# Patient Record
Sex: Female | Born: 1935 | Race: White | Hispanic: No | Marital: Married | Smoking: Former smoker
Health system: Southern US, Community
[De-identification: ages and names within clinical notes are randomized; demographics above are authoritative.]

## PROBLEM LIST (undated history)

## (undated) DIAGNOSIS — F0390 Unspecified dementia without behavioral disturbance: Secondary | ICD-10-CM

## (undated) DIAGNOSIS — F329 Major depressive disorder, single episode, unspecified: Secondary | ICD-10-CM

## (undated) DIAGNOSIS — I10 Essential (primary) hypertension: Secondary | ICD-10-CM

## (undated) DIAGNOSIS — K219 Gastro-esophageal reflux disease without esophagitis: Secondary | ICD-10-CM

## (undated) DIAGNOSIS — F32A Depression, unspecified: Secondary | ICD-10-CM

## (undated) DIAGNOSIS — E079 Disorder of thyroid, unspecified: Secondary | ICD-10-CM

## (undated) DIAGNOSIS — I639 Cerebral infarction, unspecified: Secondary | ICD-10-CM

## (undated) HISTORY — PX: ABDOMINAL HYSTERECTOMY: SHX81

---

## 2004-12-05 ENCOUNTER — Ambulatory Visit: Payer: Self-pay | Admitting: Internal Medicine

## 2006-10-07 ENCOUNTER — Ambulatory Visit: Payer: Self-pay | Admitting: Ophthalmology

## 2006-10-07 ENCOUNTER — Other Ambulatory Visit: Payer: Self-pay

## 2006-10-14 ENCOUNTER — Ambulatory Visit: Payer: Self-pay | Admitting: Ophthalmology

## 2006-11-14 ENCOUNTER — Ambulatory Visit: Payer: Self-pay | Admitting: Ophthalmology

## 2006-11-20 ENCOUNTER — Ambulatory Visit: Payer: Self-pay | Admitting: Ophthalmology

## 2007-04-22 ENCOUNTER — Inpatient Hospital Stay: Payer: Self-pay | Admitting: Specialist

## 2007-04-22 ENCOUNTER — Other Ambulatory Visit: Payer: Self-pay

## 2007-09-14 ENCOUNTER — Inpatient Hospital Stay: Payer: Self-pay | Admitting: Internal Medicine

## 2007-10-11 ENCOUNTER — Other Ambulatory Visit: Payer: Self-pay

## 2007-10-11 ENCOUNTER — Inpatient Hospital Stay: Payer: Self-pay | Admitting: Internal Medicine

## 2007-10-12 ENCOUNTER — Other Ambulatory Visit: Payer: Self-pay

## 2008-02-03 ENCOUNTER — Inpatient Hospital Stay: Payer: Self-pay | Admitting: Internal Medicine

## 2008-05-08 ENCOUNTER — Inpatient Hospital Stay: Payer: Self-pay | Admitting: Internal Medicine

## 2010-05-01 IMAGING — CR DG CHEST 1V PORT
1 series · 1 of 1 positions shown · non-contrast
Comparison: none

REASON FOR EXAM: syncope
COMMENTS:

[view not recorded]
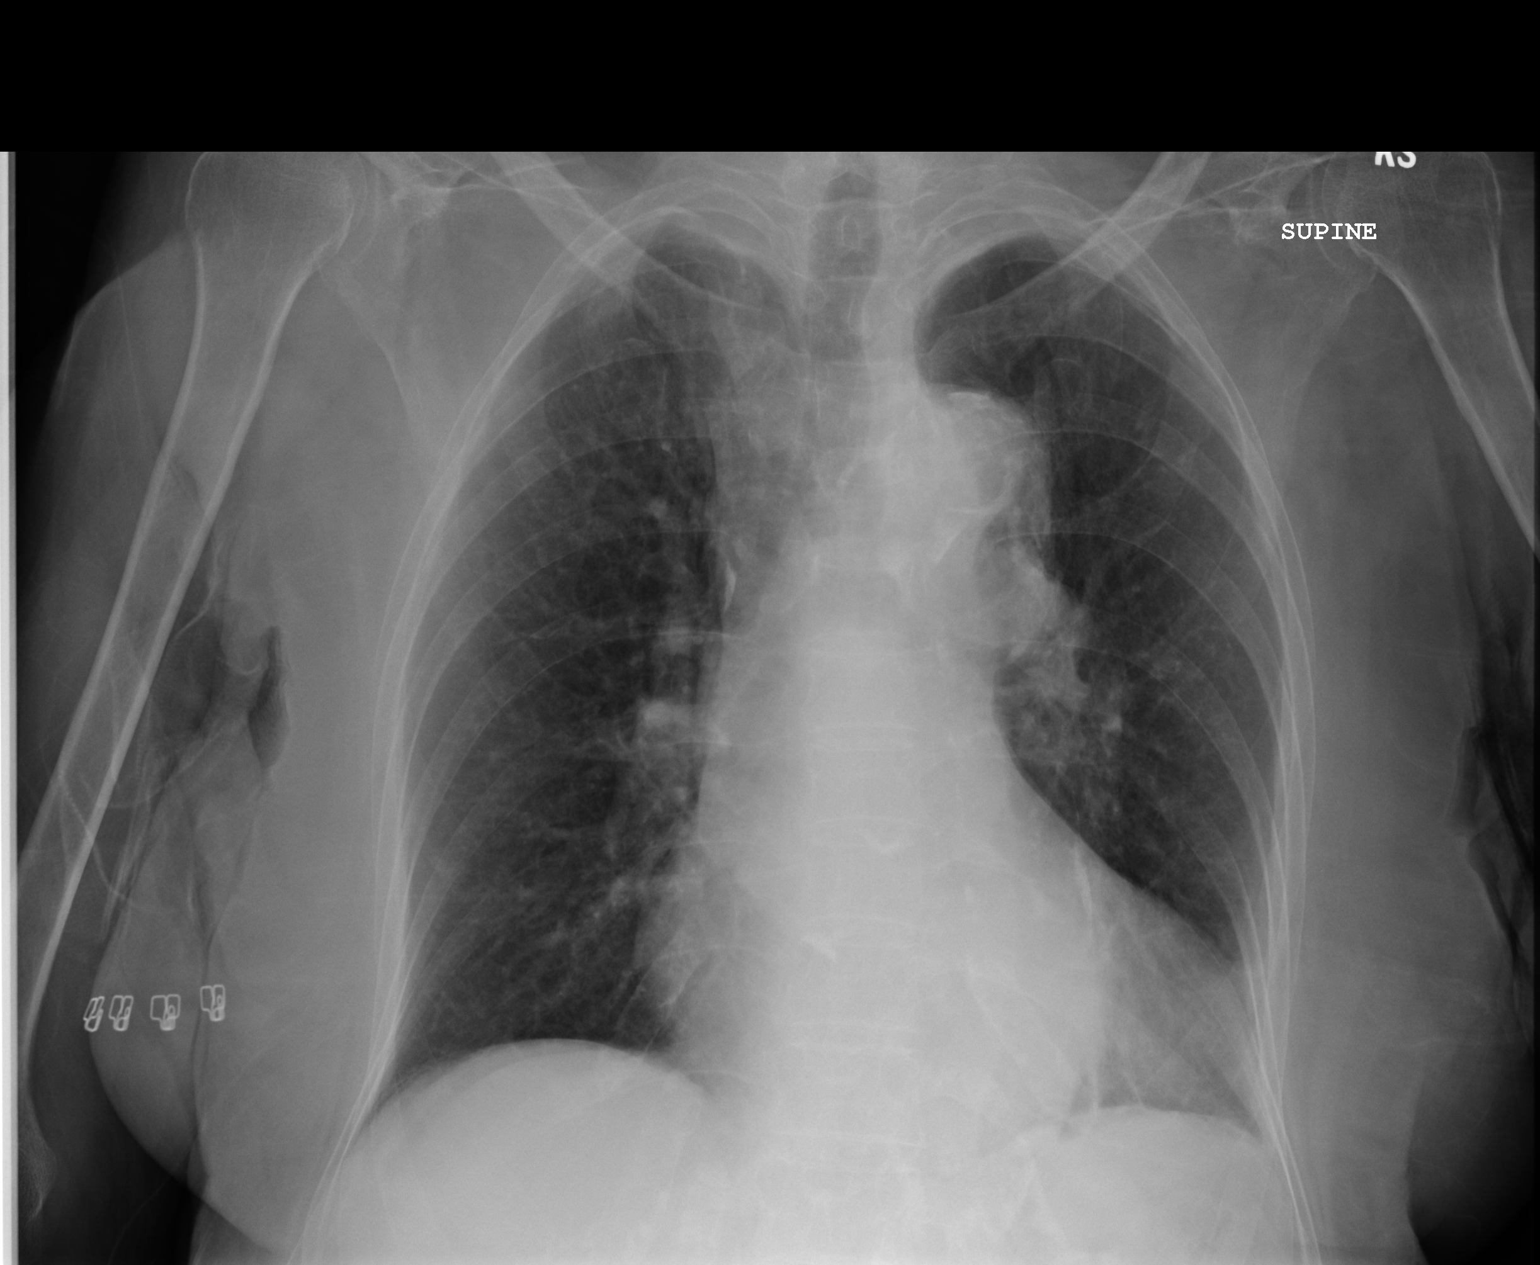

[1 of 1 positions shown; findings below may reference images not displayed]

PROCEDURE:     DXR - DXR PORTABLE CHEST SINGLE VIEW  - May 08, 2008  [DATE]

RESULT:     Comparison is made to prior study dated 02-03-08.

The lungs are hyperinflated. No focal regions of consolidation or focal
infiltrates are appreciated. The cardiac silhouette is within normal limits.
The visualized bony skeleton demonstrates no gross abnormalities.
IMPRESSION: 1. COPD without evidence of acute cardiopulmonary disease.

## 2010-05-01 IMAGING — CT CT CERVICAL SPINE WITHOUT CONTRAST
3 series · 16 of 33 positions shown, 19 images · non-contrast
Comparison: none

REASON FOR EXAM: fall, neck pain
COMMENTS:

[Series 2: axial · axial · 0.30mm/px · z∈[-313,-181]mm · 8 of 78 slices shown, 10 images]
[im 6/78  soft-tissue]
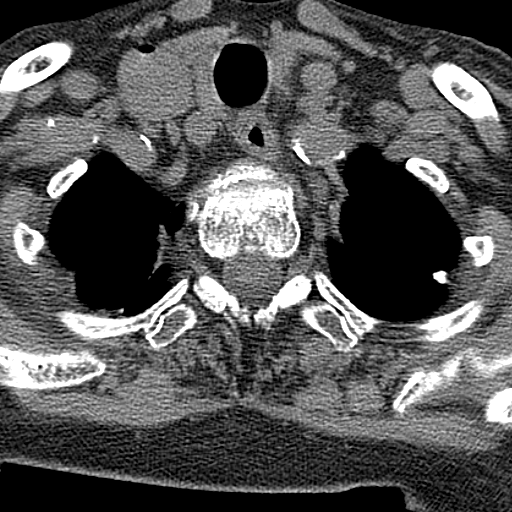
[im 6/78  bone]
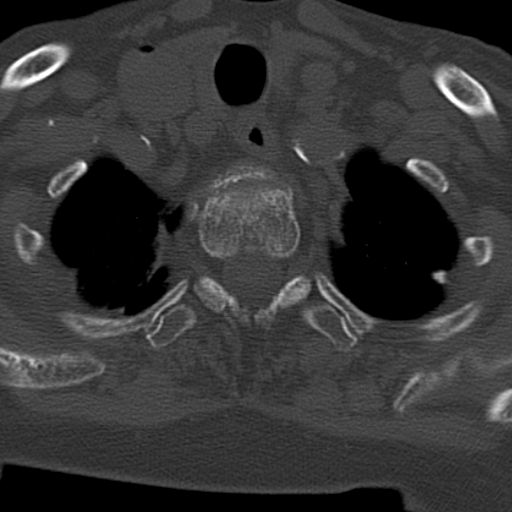
[im 18/78  bone]
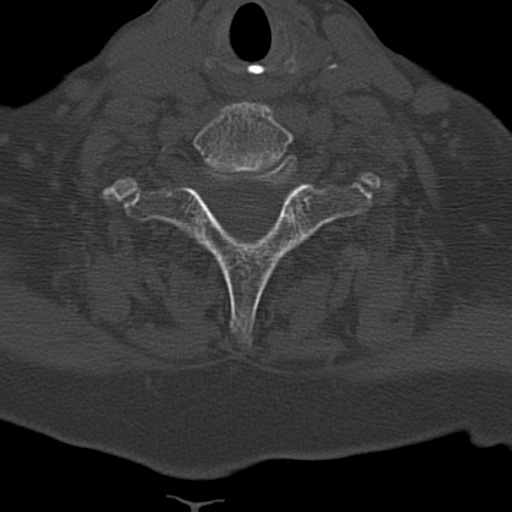
[im 24/78  bone]
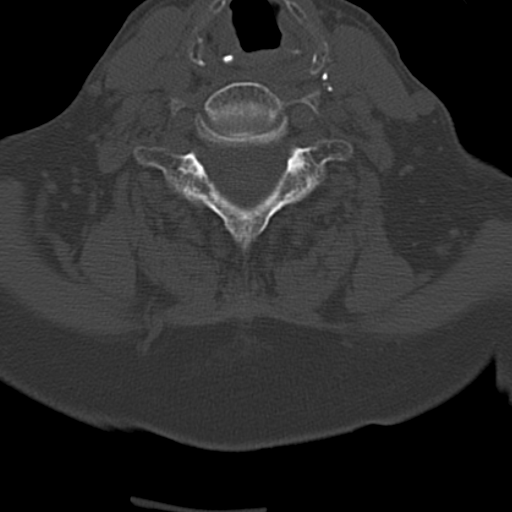
[im 36/78  bone]
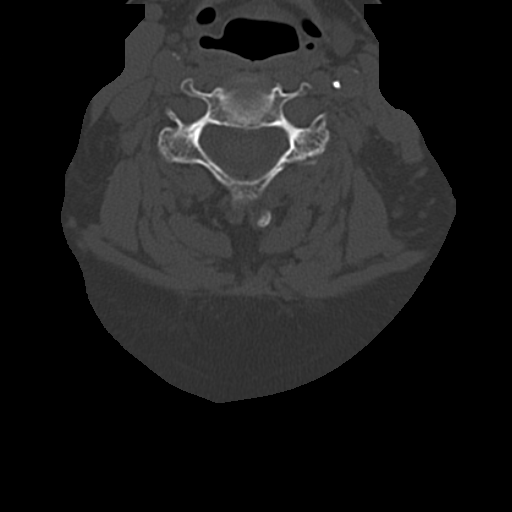
[im 42/78  soft-tissue]
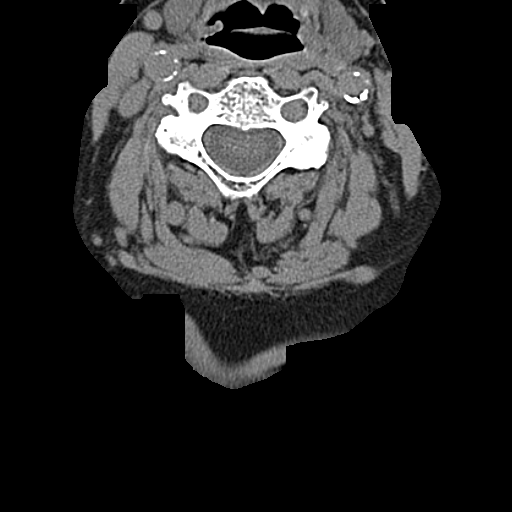
[im 42/78  bone]
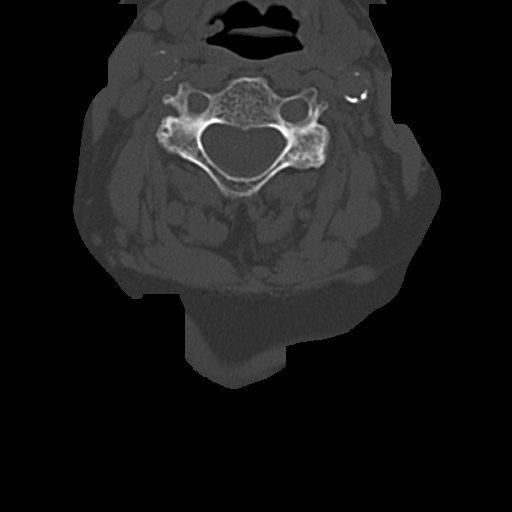
[im 54/78  bone]
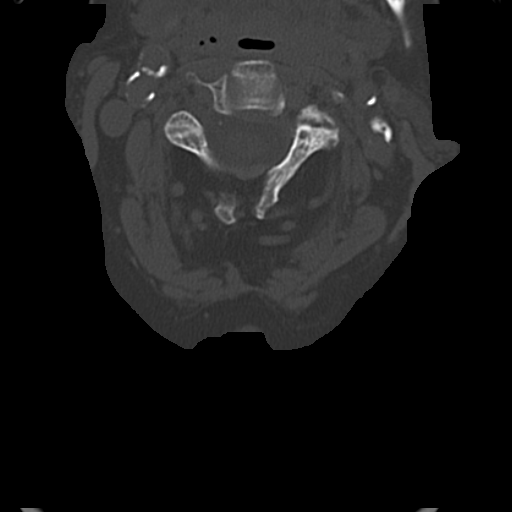
[im 60/78  bone]
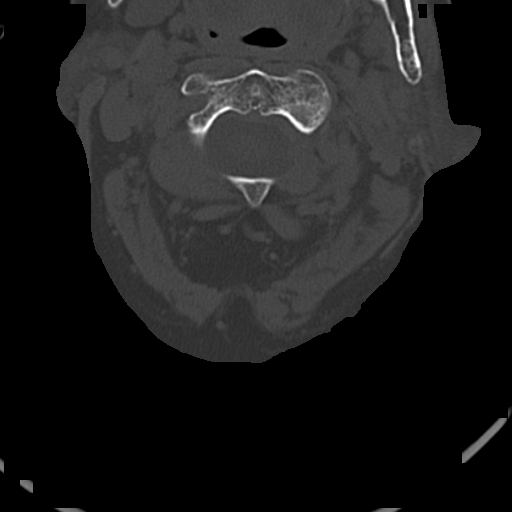
[im 72/78  bone]
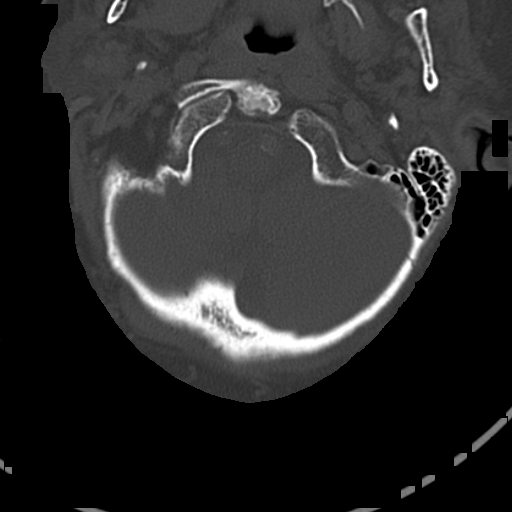

[Series 3: sagittal · sagittal · 0.33mm/px · 5 of 41 slices shown, 6 images]
[im 14/41  bone]
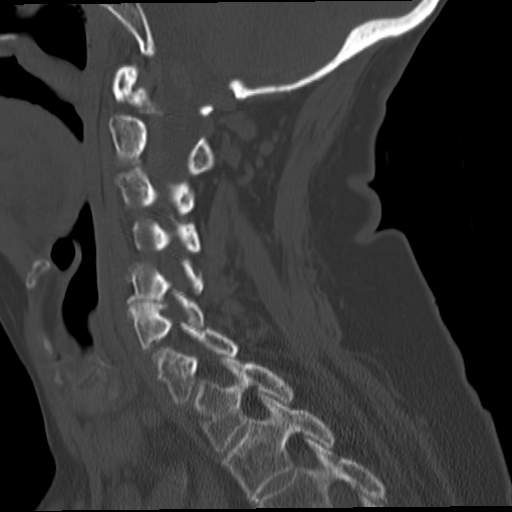
[im 17/41  bone]
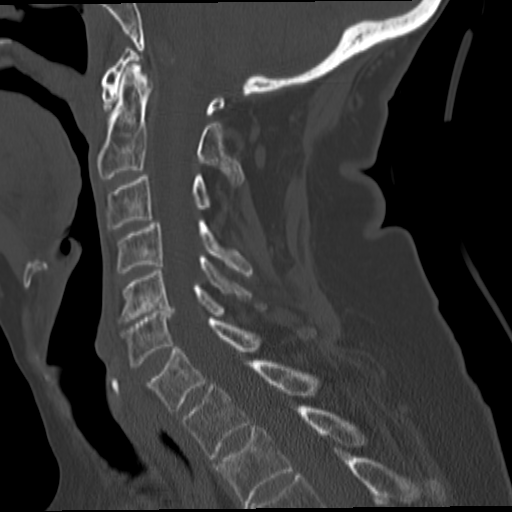
[im 21/41  soft-tissue]
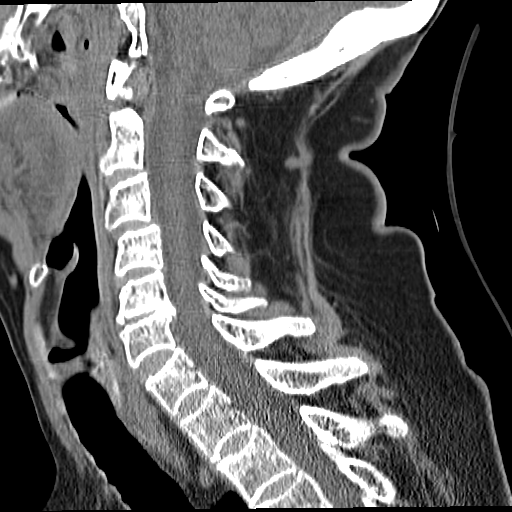
[im 21/41  bone]
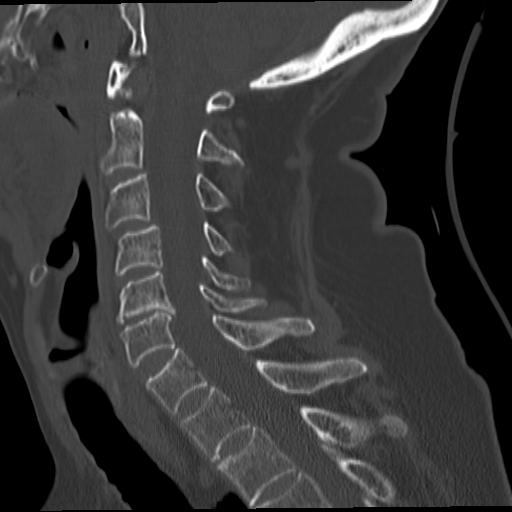
[im 24/41  bone]
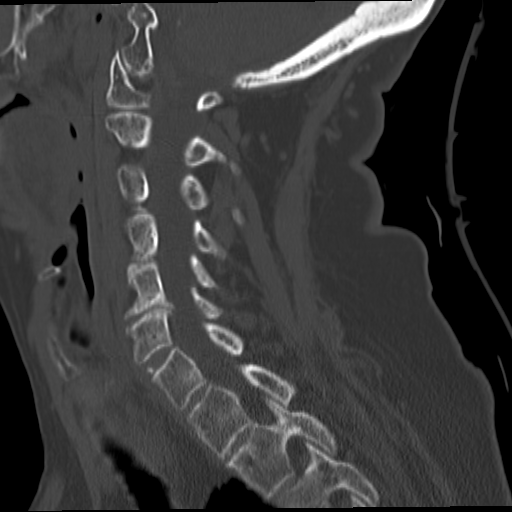
[im 27/41  bone]
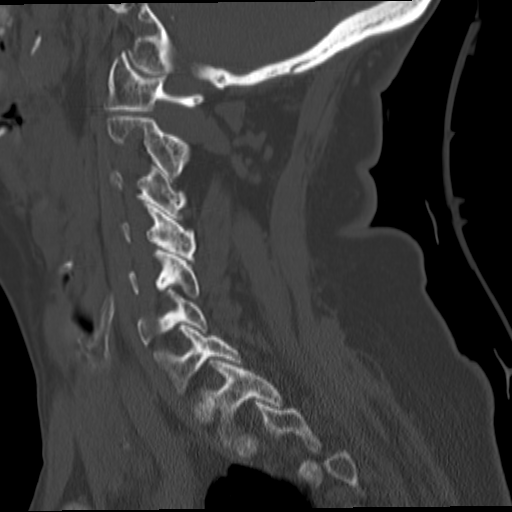

[Series 4: coronal · coronal · 0.33mm/px · 3 of 54 slices shown]
[im 11/54  bone]
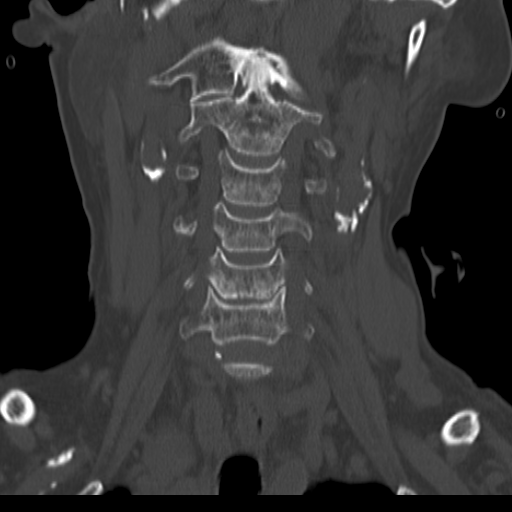
[im 22/54  bone]
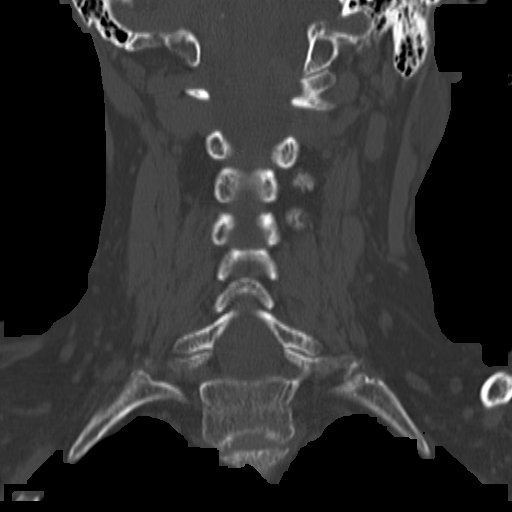
[im 32/54  bone]
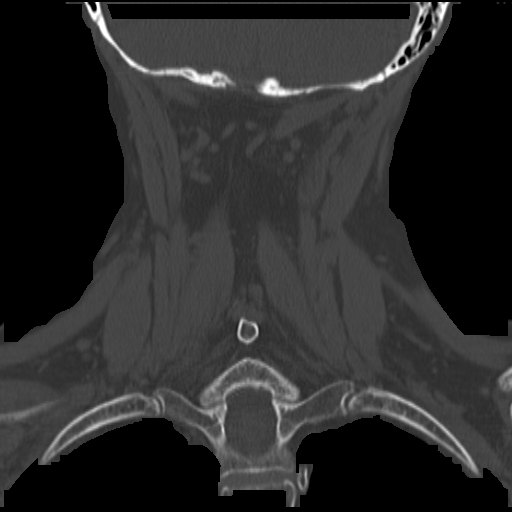

[16 of 33 positions shown; findings below may reference images not displayed]

PROCEDURE:     CT  - CT CERVICAL SPINE WO  - May 08, 2008 [DATE]

RESULT:     Multiplanar imaging of the cervical spine is obtained utilizing
a bone algorithm.

There is no evidence of acute fracture or dislocation or prevertebral soft
tissue swelling. Degenerative change is appreciated within the mid cervical
spine at the C5-6 level evident by disc space narrowing, osteophytosis and
subchondral sclerosis. There is increased lordotic curvature at this level
secondary to degenerative disc disease.  There is otherwise no further
evidence of fracture. Degenerative changes are also appreciated evident by
multilevel facet hypertrophy.
IMPRESSION: 1. Degenerative changes within the cervical spine without evidence of acute
osseous abnormalities.

Dr. Lalit of the Emergency Department was informed of these findings at
the time of the initial interpretation.

## 2011-10-24 ENCOUNTER — Emergency Department: Payer: Self-pay | Admitting: Emergency Medicine

## 2011-10-24 LAB — COMPREHENSIVE METABOLIC PANEL
Alkaline Phosphatase: 100 U/L (ref 50–136)
Anion Gap: 4 — ABNORMAL LOW (ref 7–16)
BUN: 19 mg/dL — ABNORMAL HIGH (ref 7–18)
Calcium, Total: 8.4 mg/dL — ABNORMAL LOW (ref 8.5–10.1)
EGFR (Non-African Amer.): >60
Glucose: 95 mg/dL (ref 65–99)
Osmolality: 270 (ref 275–301)
SGOT(AST): 24 U/L (ref 15–37)
Sodium: 134 mmol/L — ABNORMAL LOW (ref 136–145)

## 2011-10-24 LAB — CK TOTAL AND CKMB (NOT AT ARMC)
CK, Total: 42 U/L (ref 21–215)
CK-MB: 1.2 ng/mL (ref 0.5–3.6)

## 2011-10-24 LAB — CBC
HGB: 14.7 g/dL (ref 12.0–16.0)
MCH: 32.4 pg (ref 26.0–34.0)
MCHC: 33.3 g/dL (ref 32.0–36.0)
Platelet: 180 10*3/uL (ref 150–440)
RDW: 13.3 % (ref 11.5–14.5)
WBC: 8.6 10*3/uL (ref 3.6–11.0)

## 2011-10-24 LAB — TROPONIN I: Troponin-I: <0.02 ng/mL

## 2012-11-27 ENCOUNTER — Emergency Department: Payer: Self-pay | Admitting: Emergency Medicine

## 2012-11-27 LAB — COMPREHENSIVE METABOLIC PANEL
Albumin: 3.7 g/dL (ref 3.4–5.0)
Anion Gap: 6 — ABNORMAL LOW (ref 7–16)
BUN: 13 mg/dL (ref 7–18)
Bilirubin,Total: 0.3 mg/dL (ref 0.2–1.0)
Calcium, Total: 8.4 mg/dL — ABNORMAL LOW (ref 8.5–10.1)
EGFR (African American): >60
EGFR (Non-African Amer.): >60
Glucose: 101 mg/dL — ABNORMAL HIGH (ref 65–99)
Osmolality: 272 (ref 275–301)
Potassium: 3.5 mmol/L (ref 3.5–5.1)
SGOT(AST): 25 U/L (ref 15–37)
SGPT (ALT): 17 U/L (ref 12–78)
Sodium: 136 mmol/L (ref 136–145)
Total Protein: 6.4 g/dL (ref 6.4–8.2)

## 2012-11-27 LAB — CBC
HCT: 39.4 % (ref 35.0–47.0)
HGB: 13.5 g/dL (ref 12.0–16.0)
MCH: 30.6 pg (ref 26.0–34.0)
MCHC: 34.3 g/dL (ref 32.0–36.0)
Platelet: 198 10*3/uL (ref 150–440)
RBC: 4.41 10*6/uL (ref 3.80–5.20)

## 2015-05-10 ENCOUNTER — Emergency Department: Payer: Medicare Other

## 2015-05-10 ENCOUNTER — Inpatient Hospital Stay
Admission: EM | Admit: 2015-05-10 | Discharge: 2015-05-12 | DRG: 373 | Disposition: A | Discharge status: Skilled Nursing Facility | Payer: Medicare Other | Attending: Internal Medicine | Admitting: Internal Medicine

## 2015-05-10 ENCOUNTER — Inpatient Hospital Stay: Payer: Medicare Other

## 2015-05-10 ENCOUNTER — Encounter: Payer: Self-pay | Admitting: Intensive Care

## 2015-05-10 DIAGNOSIS — Z8673 Personal history of transient ischemic attack (TIA), and cerebral infarction without residual deficits: Secondary | ICD-10-CM

## 2015-05-10 DIAGNOSIS — Z9181 History of falling: Secondary | ICD-10-CM

## 2015-05-10 DIAGNOSIS — L8990 Pressure ulcer of unspecified site, unspecified stage: Secondary | ICD-10-CM | POA: Diagnosis present

## 2015-05-10 DIAGNOSIS — R55 Syncope and collapse: Secondary | ICD-10-CM

## 2015-05-10 DIAGNOSIS — Z87891 Personal history of nicotine dependence: Secondary | ICD-10-CM | POA: Diagnosis not present

## 2015-05-10 DIAGNOSIS — E876 Hypokalemia: Secondary | ICD-10-CM | POA: Diagnosis present

## 2015-05-10 DIAGNOSIS — Z90710 Acquired absence of both cervix and uterus: Secondary | ICD-10-CM

## 2015-05-10 DIAGNOSIS — Z23 Encounter for immunization: Secondary | ICD-10-CM | POA: Diagnosis not present

## 2015-05-10 DIAGNOSIS — Z66 Do not resuscitate: Secondary | ICD-10-CM | POA: Diagnosis present

## 2015-05-10 DIAGNOSIS — Z79899 Other long term (current) drug therapy: Secondary | ICD-10-CM

## 2015-05-10 DIAGNOSIS — I10 Essential (primary) hypertension: Secondary | ICD-10-CM | POA: Diagnosis present

## 2015-05-10 DIAGNOSIS — R296 Repeated falls: Secondary | ICD-10-CM | POA: Diagnosis present

## 2015-05-10 DIAGNOSIS — A044 Other intestinal Escherichia coli infections: Principal | ICD-10-CM | POA: Diagnosis present

## 2015-05-10 DIAGNOSIS — K219 Gastro-esophageal reflux disease without esophagitis: Secondary | ICD-10-CM | POA: Diagnosis present

## 2015-05-10 DIAGNOSIS — S139XXA Sprain of joints and ligaments of unspecified parts of neck, initial encounter: Secondary | ICD-10-CM

## 2015-05-10 DIAGNOSIS — E079 Disorder of thyroid, unspecified: Secondary | ICD-10-CM | POA: Diagnosis present

## 2015-05-10 DIAGNOSIS — E039 Hypothyroidism, unspecified: Secondary | ICD-10-CM | POA: Diagnosis present

## 2015-05-10 DIAGNOSIS — S0990XA Unspecified injury of head, initial encounter: Secondary | ICD-10-CM

## 2015-05-10 DIAGNOSIS — Z833 Family history of diabetes mellitus: Secondary | ICD-10-CM

## 2015-05-10 DIAGNOSIS — R0902 Hypoxemia: Secondary | ICD-10-CM

## 2015-05-10 DIAGNOSIS — Z7982 Long term (current) use of aspirin: Secondary | ICD-10-CM | POA: Diagnosis not present

## 2015-05-10 DIAGNOSIS — F329 Major depressive disorder, single episode, unspecified: Secondary | ICD-10-CM | POA: Diagnosis present

## 2015-05-10 DIAGNOSIS — R42 Dizziness and giddiness: Secondary | ICD-10-CM

## 2015-05-10 HISTORY — DX: Depression, unspecified: F32.A

## 2015-05-10 HISTORY — DX: Cerebral infarction, unspecified: I63.9

## 2015-05-10 HISTORY — DX: Gastro-esophageal reflux disease without esophagitis: K21.9

## 2015-05-10 HISTORY — DX: Disorder of thyroid, unspecified: E07.9

## 2015-05-10 HISTORY — DX: Major depressive disorder, single episode, unspecified: F32.9

## 2015-05-10 HISTORY — DX: Essential (primary) hypertension: I10

## 2015-05-10 LAB — CBC
HEMATOCRIT: 34.8 % — AB (ref 35.0–47.0)
HEMOGLOBIN: 11.6 g/dL — AB (ref 12.0–16.0)
MCH: 26.1 pg (ref 26.0–34.0)
MCHC: 33.3 g/dL (ref 32.0–36.0)
MCV: 78.4 fL — ABNORMAL LOW (ref 80.0–100.0)
Platelets: 209 10*3/uL (ref 150–440)
RBC: 4.45 MIL/uL (ref 3.80–5.20)
RDW: 17.8 % — ABNORMAL HIGH (ref 11.5–14.5)
WBC: 7.2 10*3/uL (ref 3.6–11.0)

## 2015-05-10 LAB — COMPREHENSIVE METABOLIC PANEL
ALBUMIN: 2.7 g/dL — AB (ref 3.5–5.0)
ALK PHOS: 117 U/L (ref 38–126)
ALT: 13 U/L — ABNORMAL LOW (ref 14–54)
ANION GAP: 4 — AB (ref 5–15)
AST: 22 U/L (ref 15–41)
BILIRUBIN TOTAL: 1 mg/dL (ref 0.3–1.2)
BUN: 16 mg/dL (ref 6–20)
CALCIUM: 7.8 mg/dL — AB (ref 8.9–10.3)
CO2: 31 mmol/L (ref 22–32)
CREATININE: 0.71 mg/dL (ref 0.44–1.00)
Chloride: 104 mmol/L (ref 101–111)
GFR calc non Af Amer: >60 mL/min (ref >60)
GLUCOSE: 83 mg/dL (ref 65–99)
Potassium: 2.9 mmol/L — CL (ref 3.5–5.1)
Sodium: 139 mmol/L (ref 135–145)
TOTAL PROTEIN: 4.9 g/dL — AB (ref 6.5–8.1)

## 2015-05-10 LAB — URINALYSIS COMPLETE WITH MICROSCOPIC (ARMC ONLY)
BACTERIA UA: NONE SEEN
Bilirubin Urine: NEGATIVE
GLUCOSE, UA: NEGATIVE mg/dL
Hgb urine dipstick: NEGATIVE
Leukocytes, UA: NEGATIVE
NITRITE: NEGATIVE
Protein, ur: NEGATIVE mg/dL
SPECIFIC GRAVITY, URINE: 1.011 (ref 1.005–1.030)
Squamous Epithelial / LPF: NONE SEEN
pH: 7 (ref 5.0–8.0)

## 2015-05-10 LAB — INFLUENZA PANEL BY PCR (TYPE A & B)
H1N1FLUPCR: NOT DETECTED
INFLAPCR: NEGATIVE
INFLBPCR: NEGATIVE

## 2015-05-10 LAB — TROPONIN I: Troponin I: <0.03 ng/mL (ref <0.031)

## 2015-05-10 MED ORDER — VERAPAMIL HCL ER 180 MG PO TBCR
180.0000 mg | EXTENDED_RELEASE_TABLET | Freq: Every day | ORAL | Status: DC
  Administered 2015-05-11 – 2015-05-12 (×2): 180 mg via ORAL
  Filled 2015-05-10 (×4): qty 1

## 2015-05-10 MED ORDER — POTASSIUM CHLORIDE CRYS ER 20 MEQ PO TBCR
40.0000 meq | EXTENDED_RELEASE_TABLET | Freq: Two times a day (BID) | ORAL | Status: AC
  Administered 2015-05-10 – 2015-05-11 (×3): 40 meq via ORAL
  Filled 2015-05-10 (×4): qty 2

## 2015-05-10 MED ORDER — SODIUM CHLORIDE 0.9 % IV SOLN
INTRAVENOUS | Status: DC
  Administered 2015-05-10 – 2015-05-12 (×4): via INTRAVENOUS

## 2015-05-10 MED ORDER — VITAMIN E 180 MG (400 UNIT) PO CAPS
400.0000 [IU] | ORAL_CAPSULE | Freq: Every day | ORAL | Status: DC
  Administered 2015-05-10 – 2015-05-12 (×3): 400 [IU] via ORAL
  Filled 2015-05-10 (×3): qty 1

## 2015-05-10 MED ORDER — DONEPEZIL HCL 5 MG PO TABS
10.0000 mg | ORAL_TABLET | Freq: Every day | ORAL | Status: DC
  Administered 2015-05-10 – 2015-05-12 (×3): 10 mg via ORAL
  Filled 2015-05-10 (×3): qty 2

## 2015-05-10 MED ORDER — PANTOPRAZOLE SODIUM 40 MG PO TBEC
40.0000 mg | DELAYED_RELEASE_TABLET | Freq: Every day | ORAL | Status: DC
  Administered 2015-05-10 – 2015-05-12 (×3): 40 mg via ORAL
  Filled 2015-05-10 (×3): qty 1

## 2015-05-10 MED ORDER — COQ-10 200 MG PO CAPS: 1.0000 | ORAL_CAPSULE | Freq: Every day | ORAL | Status: DC

## 2015-05-10 MED ORDER — FERROUS SULFATE 325 (65 FE) MG PO TABS
325.0000 mg | ORAL_TABLET | Freq: Two times a day (BID) | ORAL | Status: DC
  Administered 2015-05-10 – 2015-05-12 (×4): 325 mg via ORAL
  Filled 2015-05-10 (×4): qty 1

## 2015-05-10 MED ORDER — ACETAMINOPHEN 325 MG PO TABS
650.0000 mg | ORAL_TABLET | Freq: Four times a day (QID) | ORAL | Status: DC | PRN
  Administered 2015-05-10 – 2015-05-12 (×2): 650 mg via ORAL
  Filled 2015-05-10 (×2): qty 2

## 2015-05-10 MED ORDER — LEVOTHYROXINE SODIUM 125 MCG PO TABS
125.0000 ug | ORAL_TABLET | Freq: Every day | ORAL | Status: DC
  Administered 2015-05-11: 125 ug via ORAL
  Filled 2015-05-10 (×2): qty 1

## 2015-05-10 MED ORDER — HEPARIN SODIUM (PORCINE) 5000 UNIT/ML IJ SOLN
5000.0000 [IU] | Freq: Three times a day (TID) | INTRAMUSCULAR | Status: DC
  Administered 2015-05-10 – 2015-05-12 (×6): 5000 [IU] via SUBCUTANEOUS
  Filled 2015-05-10 (×6): qty 1

## 2015-05-10 MED ORDER — SODIUM CHLORIDE 0.9% FLUSH
3.0000 mL | Freq: Two times a day (BID) | INTRAVENOUS | Status: DC
  Administered 2015-05-12: 3 mL via INTRAVENOUS

## 2015-05-10 MED ORDER — SODIUM CHLORIDE 0.9 % IV SOLN
1000.0000 mL | Freq: Once | INTRAVENOUS | Status: AC
  Administered 2015-05-10: 1000 mL via INTRAVENOUS

## 2015-05-10 MED ORDER — HYDRALAZINE HCL 20 MG/ML IJ SOLN
10.0000 mg | Freq: Four times a day (QID) | INTRAMUSCULAR | Status: DC | PRN
  Administered 2015-05-10: 10 mg via INTRAVENOUS
  Filled 2015-05-10: qty 1

## 2015-05-10 MED ORDER — ASPIRIN EC 81 MG PO TBEC
81.0000 mg | DELAYED_RELEASE_TABLET | Freq: Every day | ORAL | Status: DC
  Administered 2015-05-10 – 2015-05-12 (×3): 81 mg via ORAL
  Filled 2015-05-10 (×3): qty 1

## 2015-05-10 MED ORDER — LORAZEPAM 0.5 MG PO TABS: 0.5000 mg | ORAL_TABLET | Freq: Two times a day (BID) | ORAL | Status: DC | PRN

## 2015-05-10 MED ORDER — CYANOCOBALAMIN 1000 MCG/ML IJ SOLN
1000.0000 ug | INTRAMUSCULAR | Status: DC
  Filled 2015-05-10: qty 1

## 2015-05-10 NOTE — H&P (Signed)
[facility] - [place] at [facility]   PATIENT NAME: Shelley Newton    MR#:  [id number]  DATE OF BIRTH:  [birth year]  DATE OF ADMISSION:  05/10/2015  PRIMARY CARE PHYSICIAN: No primary care provider on file.   REQUESTING/REFERRING PHYSICIAN: Kinner  CHIEF COMPLAINT:   Chief Complaint  Patient presents with  . Fall    HISTORY OF PRESENT ILLNESS: Shelley Newton  is a 82 y.o. female with a known history of hypertension, stroke, thyroid disease, depression- lives with son and able to walk with a walker. For last few days mainly 3-4 days she is generalized weak and has been falling down. She fell down 3 days ago and could not get up herself so she was on the floor for 2-3 hours. Finally son had to help her to get up. Also when she went to the bathroom she could not clean herself properly and she messed up all over herself with stool. She had a fall down a few times in last few days and injured herself with minor injuries all over. So son brought her to the emergency room today. Patient has slight confusion so history obtained from her son who lives with her. As per him he denies any fever but says that patient always feels cold and that is nothing new for her to ask for multiple blankets and heating blankets. He denies any cough or urinary symptoms or shortness of breath on her. She has been falling down each time she tried to get up today.  PAST MEDICAL HISTORY:   Past Medical History  Diagnosis Date  . Hypertension   . Stroke (HCC)   . Thyroid disease   . GERD (gastroesophageal reflux disease)   . Depression     PAST SURGICAL HISTORY: Past Surgical History  Procedure Laterality Date  . Abdominal hysterectomy      SOCIAL HISTORY:  Social History  Substance Use Topics  . Smoking status: Former Games developer  . Smokeless tobacco: Never Used  . Alcohol Use: No    FAMILY HISTORY:  Family History  Problem Relation Age of Onset  . Diabetes Brother     DRUG  ALLERGIES: No Known Allergies  REVIEW OF SYSTEMS:   CONSTITUTIONAL: No fever, positive for fatigue or weakness.  EYES: No blurred or double vision.  EARS, NOSE, AND THROAT: No tinnitus or ear pain.  RESPIRATORY: No cough, shortness of breath, wheezing or hemoptysis.  CARDIOVASCULAR: No chest pain, orthopnea, edema.  GASTROINTESTINAL: No nausea, vomiting, diarrhea or abdominal pain.  GENITOURINARY: No dysuria, hematuria.  ENDOCRINE: No polyuria, nocturia,  HEMATOLOGY: No anemia, easy bruising or bleeding SKIN: No rash or lesion. MUSCULOSKELETAL: No joint pain or arthritis.   NEUROLOGIC: No tingling, numbness, have generalized weakness.  PSYCHIATRY: No anxiety or depression.   MEDICATIONS AT HOME:  Prior to Admission medications   Medication Sig Start Date End Date Taking? Authorizing Provider  aspirin EC 81 MG tablet Take 1 tablet by mouth daily.   Yes Historical Provider, MD  Coenzyme Q10 (COQ-10) 200 MG CAPS Take 1 tablet by mouth daily.   Yes Historical Provider, MD  cyanocobalamin (,VITAMIN B-12,) 1000 MCG/ML injection Inject 1 mL into the muscle every 30 (thirty) days. 02/03/15  Yes Historical Provider, MD  donepezil (ARICEPT) 10 MG tablet Take 1 tablet by mouth daily. 04/24/15  Yes Historical Provider, MD  ferrous sulfate 325 (65 FE) MG tablet Take 1 tablet by mouth 2 (two) times daily. 04/24/15  Yes Historical Provider, MD  furosemide (LASIX) 20 MG tablet Take 1 tablet by mouth daily. 04/21/15  Yes Historical Provider, MD  KLOR-CON 10 10 MEQ tablet Take 1 tablet by mouth daily. 02/28/15  Yes Historical Provider, MD  levothyroxine (SYNTHROID, LEVOTHROID) 125 MCG tablet Take 125 mcg by mouth daily before breakfast.   Yes Historical Provider, MD  LORazepam (ATIVAN) 0.5 MG tablet Take 1 tablet by mouth 2 (two) times daily as needed. 04/28/15  Yes Historical Provider, MD  omeprazole (PRILOSEC) 20 MG capsule Take 1 capsule by mouth daily. 07/09/14  Yes Historical Provider, MD  verapamil  (CALAN-SR) 180 MG CR tablet Take 1 tablet by mouth daily. 04/21/15  Yes Historical Provider, MD  vitamin E 400 UNIT capsule Take 400 Units by mouth daily.   Yes Historical Provider, MD      PHYSICAL EXAMINATION:   VITAL SIGNS: Blood pressure 167/88, pulse 69, temperature 98.2 F (36.8 C), temperature source Oral, resp. rate 21, height  (1.702 m), weight 68.04 kg (150 lb), SpO2 94 %.  GENERAL:  82 y.o.-year-old patient lying in the bed with no acute distress.  EYES: Pupils equal, round, reactive to light and accommodation. No scleral icterus. Extraocular muscles intact.  HEENT: Head atraumatic, normocephalic. Oropharynx and nasopharynx clear.  NECK:  Supple, no jugular venous distention. No thyroid enlargement, no tenderness.  LUNGS: Normal breath sounds bilaterally, no wheezing, rales,rhonchi or crepitation. No use of accessory muscles of respiration.  CARDIOVASCULAR: S1, S2 normal. Systolic murmurs,   ABDOMEN: Soft, nontender, nondistended. Bowel sounds present. No organomegaly or mass.  EXTREMITIES: No pedal edema, cyanosis, or clubbing.  NEUROLOGIC: Cranial nerves II through XII are intact. Muscle strength 4/5 in all extremities. Sensation intact. Gait not checked.  PSYCHIATRIC: The patient is alert and oriented x 3.  SKIN: No obvious rash, lesion, or ulcer.   LABORATORY PANEL:   CBC  Recent Labs Lab 05/10/15 1026  WBC 7.2  HGB 11.6*  HCT 34.8*  PLT 209  MCV 78.4*  MCH 26.1  MCHC 33.3  RDW 17.8*   ------------------------------------------------------------------------------------------------------------------  Chemistries   Recent Labs Lab 05/10/15 1026  NA 139  K 2.9*  CL 104  CO2 31  GLUCOSE 83  BUN 16  CREATININE 0.71  CALCIUM 7.8*  AST 22  ALT 13*  ALKPHOS 117  BILITOT 1.0   ------------------------------------------------------------------------------------------------------------------ estimated creatinine clearance is 45.5 mL/min (by C-G  formula based on Cr of 0.71). ------------------------------------------------------------------------------------------------------------------ No results for input(s): TSH, T4TOTAL, T3FREE, THYROIDAB in the last 72 hours.  Invalid input(s): FREET3   Coagulation profile No results for input(s): INR, PROTIME in the last 168 hours. ------------------------------------------------------------------------------------------------------------------- No results for input(s): DDIMER in the last 72 hours. -------------------------------------------------------------------------------------------------------------------  Cardiac Enzymes  Recent Labs Lab 05/10/15 1026  TROPONINI <0.03   ------------------------------------------------------------------------------------------------------------------ Invalid input(s): POCBNP  ---------------------------------------------------------------------------------------------------------------  Urinalysis No results found for: COLORURINE, APPEARANCEUR, LABSPEC, PHURINE, GLUCOSEU, HGBUR, BILIRUBINUR, KETONESUR, PROTEINUR, UROBILINOGEN, NITRITE, LEUKOCYTESUR   RADIOLOGY: Dg Shoulder Right  05/10/2015  CLINICAL DATA:  Recent fall with right shoulder pain, initial encounter EXAM: RIGHT SHOULDER - 2+ VIEW COMPARISON:  None. FINDINGS: Degenerative changes of the acromioclavicular joint are noted. No acute fracture or dislocation is seen. No gross soft tissue abnormality is noted. IMPRESSION: Degenerative change without acute abnormality. Electronically Signed   By: Alcide Clever M.D.   On: 05/10/2015 11:56   Ct Head Wo Contrast  05/10/2015  CLINICAL DATA:  Fall.  Hematoma to left posterior head. EXAM: CT HEAD WITHOUT CONTRAST CT CERVICAL SPINE WITHOUT CONTRAST TECHNIQUE: Multidetector  CT imaging of the head and cervical spine was performed following the standard protocol without intravenous contrast. Multiplanar CT image reconstructions of the cervical spine  were also generated. COMPARISON:  11/27/2012 FINDINGS: CT HEAD FINDINGS Sinuses/Soft tissues: Surgical changes about both globes. Left occipital scalp soft tissue swelling is moderate. Osteopenia. No skull fracture. Clear paranasal sinuses and mastoid air cells. Intracranial: Moderate to marked low density in the periventricular white matter likely related to small vessel disease. No mass lesion, hemorrhage, hydrocephalus, acute infarct, intra-axial, or extra-axial fluid collection. CT CERVICAL SPINE FINDINGS Spinal visualization through the bottom of T1. Prevertebral soft tissues are within normal limits. Bilateral carotid atherosclerosis. Biapical pleural-parenchymal scarring, without pneumothorax. Trace left pleural thickening versus less likely fluid. Skull base intact. Multilevel spondylosis, including loss of intervertebral disc height and subchondral sclerosis involving C4-5 and C5-6. Advanced facet arthropathy, especially at C3-5 on the right. Coronal reformats demonstrate a normal C1-C2 articulation. IMPRESSION: 1. Left occipital scalp soft tissue swelling, without acute intracranial abnormality. 2. Cervical spondylosis, without acute fracture or subluxation. 3. Moderate to marked small vessel ischemic change. Electronically Signed   By: Jeronimo Greaves M.D.   On: 05/10/2015 12:04   Ct Cervical Spine Wo Contrast  05/10/2015  CLINICAL DATA:  Fall.  Hematoma to left posterior head. EXAM: CT HEAD WITHOUT CONTRAST CT CERVICAL SPINE WITHOUT CONTRAST TECHNIQUE: Multidetector CT imaging of the head and cervical spine was performed following the standard protocol without intravenous contrast. Multiplanar CT image reconstructions of the cervical spine were also generated. COMPARISON:  11/27/2012 FINDINGS: CT HEAD FINDINGS Sinuses/Soft tissues: Surgical changes about both globes. Left occipital scalp soft tissue swelling is moderate. Osteopenia. No skull fracture. Clear paranasal sinuses and mastoid air cells.  Intracranial: Moderate to marked low density in the periventricular white matter likely related to small vessel disease. No mass lesion, hemorrhage, hydrocephalus, acute infarct, intra-axial, or extra-axial fluid collection. CT CERVICAL SPINE FINDINGS Spinal visualization through the bottom of T1. Prevertebral soft tissues are within normal limits. Bilateral carotid atherosclerosis. Biapical pleural-parenchymal scarring, without pneumothorax. Trace left pleural thickening versus less likely fluid. Skull base intact. Multilevel spondylosis, including loss of intervertebral disc height and subchondral sclerosis involving C4-5 and C5-6. Advanced facet arthropathy, especially at C3-5 on the right. Coronal reformats demonstrate a normal C1-C2 articulation. IMPRESSION: 1. Left occipital scalp soft tissue swelling, without acute intracranial abnormality. 2. Cervical spondylosis, without acute fracture or subluxation. 3. Moderate to marked small vessel ischemic change. Electronically Signed   By: Jeronimo Greaves M.D.   On: 05/10/2015 12:04    EKG: Orders placed or performed in visit on 11/27/12  . EKG 12-Lead  . EKG 12-Lead    IMPRESSION AND PLAN:  * Syncopal episode with generalized weakness with multiple falls.  Orthostatic vitals were checked, they are not very impressive.  CT scan of the head and cervical spine are done to rule out any serious injuries  Patient does not have urinalysis, chest x-ray, influenza test ordered.  I strongly suspect she might be having some infection.  Currently I'll give her some IV fluid, and I ordered all the workup.  If all of them comes out negative then we may need to consider the possibility of stroke, then may need an MRI.  PT evaluation.  * Uncontrolled hypertension   We'll continue home medications for now.  Given hydralazine injection as needed.  * Hypokalemia  Replace orally and check tomorrow.  * Hypothyroidism  Continue levothyroxine.  All the records  are reviewed and case  discussed with ED provider. Management plans discussed with the patient, family and they are in agreement.  CODE STATUS: DO NOT RESUSCITATE, her son Kathlene NovemberMike is healthcare power of attorney. Code Status History    This patient does not have a recorded code status. Please follow your organizational policy for patients in this situation.    Advance Directive Documentation        Most Recent Value   Type of Advance Directive  Healthcare Power of Attorney   Pre-existing out of facility DNR order (yellow form or pink MOST form)     "MOST" Form in Place?         TOTAL TIME TAKING CARE OF THIS PATIENT: 50 minutes.    Altamese DillingVACHHANI, Sabre Leonetti M.D on 05/10/2015   Between 7am to 6pm - Pager - [phone]  After 6pm go to [email or web address] - password [email or web address] [facility]  [facility]  Office  [phone]  CC: Primary care physician; No primary care provider on file.   Note: This dictation was prepared with Dragon dictation along with smaller phrase technology. Any transcriptional errors that result from this process are unintentional.

## 2015-05-10 NOTE — ED Provider Notes (Signed)
[facility] Emergency Department Provider Note  ____________________________________________    I have reviewed the triage vital signs and the nursing notes.   HISTORY  Chief Complaint Fall    HPI Shelley Newton is a 82 y.o. female who presents after a fall. Patient reports she felt dizzy and fell overnight on the way to the bathroom. She complains of mild right-sided shoulder pain but is able to range her shoulder. She complains of a "goose egg on the back of her head "and neck pain. She denies chest pain or palpitations. No fevers or chills. She has had multiple falls recently she does live alone.     Past Medical History  Diagnosis Date  . Hypertension   . Stroke (HCC)   . Thyroid disease   . GERD (gastroesophageal reflux disease)   . Depression     There are no active problems to display for this patient.   Past Surgical History  Procedure Laterality Date  . Abdominal hysterectomy      Current Outpatient Rx  Name  Route  Sig  Dispense  Refill  . aspirin EC 81 MG tablet   Oral   Take 1 tablet by mouth daily.         . Coenzyme Q10 (COQ-10) 200 MG CAPS   Oral   Take 1 tablet by mouth daily.         . cyanocobalamin (,VITAMIN B-12,) 1000 MCG/ML injection   Intramuscular   Inject 1 mL into the muscle every 30 (thirty) days.         Marland Kitchen. omeprazole (PRILOSEC) 20 MG capsule   Oral   Take 1 capsule by mouth daily.         . vitamin E 400 UNIT capsule   Oral   Take 400 Units by mouth daily.           Allergies Review of patient's allergies indicates no known allergies.  History reviewed. No pertinent family history.  Social History Social History  Substance Use Topics  . Smoking status: Former Games developermoker  . Smokeless tobacco: Never Used  . Alcohol Use: No    Review of Systems  Constitutional: Negative for fever. Eyes: Negative for redness ENT: Negative for sore throat Cardiovascular: Negative for chest  pain Respiratory: Negative for shortness of breath. Gastrointestinal: Negative for abdominal pain Genitourinary: Negative for dysuria. Musculoskeletal: Negative for back pain.Positive for neck pain and shoulder pain Skin: Negative for rash. Neurological: Negative for focal weakness. Positive for dizziness Psychiatric: no anxiety    ____________________________________________   PHYSICAL EXAM:  VITAL SIGNS: ED Triage Vitals  Enc Vitals Group     BP 05/10/15 1009 169/93 mmHg     Pulse Rate 05/10/15 1009 79     Resp 05/10/15 1009 15     Temp 05/10/15 1009 98.5 F (36.9 C)     Temp Source 05/10/15 1009 Oral     SpO2 05/10/15 1009 92 %     Weight 05/10/15 1009 150 lb (68.04 kg)     Height 05/10/15 1009 5\' 7"  (1.702 m)     Head Cir --      [facility] Flow --      Pain Score 05/10/15 1012 5     Pain Loc --      Pain Edu? --      Excl. in GC? --      Constitutional: Alert and oriented. Well appearing and in no distress.  Eyes: Conjunctivae are normal. No erythema or  injection ENT   Head: Normocephalic. Tenderness and swelling posterior left scalp   Mouth/Throat: Mucous membranes are moist. Cardiovascular: Normal rate, regular rhythm. Normal and symmetric distal pulses are present in the upper extremities. PVCs noted on monitor Respiratory: Normal respiratory effort without tachypnea nor retractions. Breath sounds are clear and equal bilaterally.  Gastrointestinal: Soft and non-tender in all quadrants. No distention. There is no CVA tenderness. Genitourinary: deferred Musculoskeletal: Mild tenderness to palpation over the shoulder but normal range of motion. No vertebral tenderness to palpation. Normal strength in all extremities Neurologic:  Normal speech and language. No gross focal neurologic deficits are appreciated. Skin:  Skin is warm, dry and intact. No rash noted. Psychiatric: Mood and affect are normal. Patient exhibits appropriate insight and  judgment.  ____________________________________________    LABS (pertinent positives/negatives)  Labs Reviewed  CBC - Abnormal; Notable for the following:    Hemoglobin 11.6 (*)    HCT 34.8 (*)    MCV 78.4 (*)    RDW 17.8 (*)    All other components within normal limits  COMPREHENSIVE METABOLIC PANEL - Abnormal; Notable for the following:    Potassium 2.9 (*)    Calcium 7.8 (*)    Total Protein 4.9 (*)    Albumin 2.7 (*)    ALT 13 (*)    Anion gap 4 (*)    All other components within normal limits  TROPONIN I    ____________________________________________   EKG  ED ECG REPORT I, Jene Every, the attending physician, personally viewed and interpreted this ECG.  Date: 05/10/2015 EKG Time: 10:13 AM Rate: 77 Rhythm: normal sinus rhythm QRS Axis: normal Intervals: Left anterior fascicular block ST/T Wave abnormalities: normal Conduction Disturbances: none    ____________________________________________    RADIOLOGY  CT head and cervical spine unremarkable Shoulder x-ray no fracture  ____________________________________________   PROCEDURES  Procedure(s) performed: none  Critical Care performed: none  ____________________________________________   INITIAL IMPRESSION / ASSESSMENT AND PLAN / ED COURSE  Pertinent labs & imaging results that were available during my care of the patient were reviewed by me and considered in my medical decision making (see chart for details).  Patient presents after a fall. She is orthostatic. We'll give fluids to see if she improves.  Continued orthostasis after 1 L of fluids. Given her multiple falls, PVCs on the monitor and continued dizziness feel that admission is appropriate  ____________________________________________   FINAL CLINICAL IMPRESSION(S) / ED DIAGNOSES  Final diagnoses:  Closed head injury, initial encounter  Cervical sprain, initial encounter  Orthostatic dizziness          Jene Every, MD 05/10/15 1446

## 2015-05-10 NOTE — Progress Notes (Signed)

## 2015-05-10 NOTE — ED Notes (Addendum)
Patient arrived by EMS from home. Per pt her son lives with her and three daughters live on the same road. Patients family reports they found patient on the bathroom floor from a fall. Patient c/o R shoulder pain, R leg pain, and there is a current hematoma on back L side of head. Patient is 90% on RA. Does not wear oxygen at home. Patient has multiple abrasions from multiple falls. Patient is unsure is she had LOC when she first fell but states she was on the floor for 2 hours until she was found. Patient currently takes aspirin

## 2015-05-11 DIAGNOSIS — L8990 Pressure ulcer of unspecified site, unspecified stage: Secondary | ICD-10-CM | POA: Insufficient documentation

## 2015-05-11 LAB — C DIFFICILE QUICK SCREEN W PCR REFLEX
C DIFFICILE (CDIFF) INTERP: NEGATIVE
C DIFFICILE (CDIFF) TOXIN: NEGATIVE
C Diff antigen: NEGATIVE

## 2015-05-11 LAB — BASIC METABOLIC PANEL
ANION GAP: 3 — AB (ref 5–15)
BUN: 14 mg/dL (ref 6–20)
CALCIUM: 7.5 mg/dL — AB (ref 8.9–10.3)
CO2: 26 mmol/L (ref 22–32)
Chloride: 109 mmol/L (ref 101–111)
Creatinine, Ser: 0.5 mg/dL (ref 0.44–1.00)
GLUCOSE: 80 mg/dL (ref 65–99)
POTASSIUM: 4.1 mmol/L (ref 3.5–5.1)
Sodium: 138 mmol/L (ref 135–145)

## 2015-05-11 LAB — GASTROINTESTINAL PANEL BY PCR, STOOL (REPLACES STOOL CULTURE)
ADENOVIRUS F40/41: NOT DETECTED
Astrovirus: NOT DETECTED
CAMPYLOBACTER SPECIES: NOT DETECTED
CRYPTOSPORIDIUM: NOT DETECTED
CYCLOSPORA CAYETANENSIS: NOT DETECTED
E. COLI O157: NOT DETECTED
ENTEROPATHOGENIC E COLI (EPEC): DETECTED — AB
Entamoeba histolytica: NOT DETECTED
Enteroaggregative E coli (EAEC): NOT DETECTED
Enterotoxigenic E coli (ETEC): NOT DETECTED
Giardia lamblia: NOT DETECTED
Norovirus GI/GII: NOT DETECTED
PLESIMONAS SHIGELLOIDES: NOT DETECTED
ROTAVIRUS A: NOT DETECTED
SAPOVIRUS (I, II, IV, AND V): NOT DETECTED
SHIGA LIKE TOXIN PRODUCING E COLI (STEC): NOT DETECTED
SHIGELLA/ENTEROINVASIVE E COLI (EIEC): NOT DETECTED
Salmonella species: NOT DETECTED
VIBRIO SPECIES: NOT DETECTED
Vibrio cholerae: NOT DETECTED
YERSINIA ENTEROCOLITICA: NOT DETECTED

## 2015-05-11 LAB — CBC
HEMATOCRIT: 35.3 % (ref 35.0–47.0)
HEMOGLOBIN: 11.6 g/dL — AB (ref 12.0–16.0)
MCH: 26.2 pg (ref 26.0–34.0)
MCHC: 32.9 g/dL (ref 32.0–36.0)
MCV: 79.7 fL — ABNORMAL LOW (ref 80.0–100.0)
Platelets: 200 10*3/uL (ref 150–440)
RBC: 4.43 MIL/uL (ref 3.80–5.20)
RDW: 18.3 % — ABNORMAL HIGH (ref 11.5–14.5)
WBC: 6.8 10*3/uL (ref 3.6–11.0)

## 2015-05-11 MED ORDER — CIPROFLOXACIN IN D5W 400 MG/200ML IV SOLN
400.0000 mg | Freq: Two times a day (BID) | INTRAVENOUS | Status: DC
  Administered 2015-05-11 – 2015-05-12 (×2): 400 mg via INTRAVENOUS
  Filled 2015-05-11 (×3): qty 200

## 2015-05-11 MED ORDER — ONDANSETRON HCL 4 MG/2ML IJ SOLN
4.0000 mg | Freq: Four times a day (QID) | INTRAMUSCULAR | Status: DC | PRN
  Administered 2015-05-11: 4 mg via INTRAVENOUS
  Filled 2015-05-11: qty 2

## 2015-05-11 MED ORDER — INFLUENZA VAC SPLIT QUAD 0.5 ML IM SUSY
0.5000 mL | PREFILLED_SYRINGE | INTRAMUSCULAR | Status: AC
Start: 2015-05-12 — End: 2015-05-12
  Administered 2015-05-12: 0.5 mL via INTRAMUSCULAR
  Filled 2015-05-11: qty 0.5

## 2015-05-11 NOTE — Progress Notes (Signed)
Paged MD, patient continues to have emesis and diarrhea.  Placed on c-diff precautions until we can rule out c-diff.  Requesting Zofran for continued emesis.

## 2015-05-11 NOTE — Clinical Social Work Placement (Signed)
   CLINICAL SOCIAL WORK PLACEMENT  NOTE  Date:  05/11/2015  Patient Details  Name: Shelley Newton MRN: [id number] Date of Birth: 09-16-1922  Clinical Social Work is seeking post-discharge placement for this patient at the Skilled  Nursing Facility level of care (*CSW will initial, date and re-position this form in  chart as items are completed):  Yes   Patient/family provided with [facility] Social Work Department's list of facilities offering this level of care within the geographic area requested by the patient (or if unable, by the patient's family).  Yes   Patient/family informed of their freedom to choose among providers that offer the needed level of care, that participate in Medicare, Medicaid or managed care program needed by the patient, have an available bed and are willing to accept the patient.  Yes   Patient/family informed of [facility] ownership interest [facility] and [facility], as well as of the fact that they are under no obligation to receive care at these facilities.  PASRR submitted to EDS on       PASRR number received on       Existing PASRR number confirmed on 05/11/15     FL2 transmitted to all facilities in geographic area requested by pt/family on 05/11/15     FL2 transmitted to all facilities within larger geographic area on       Patient informed that his/her managed care company has contracts with or will negotiate with certain facilities, including the following:        Yes   Patient/family informed of bed offers received.  Patient chooses bed at  ([facility] )     Physician recommends and patient chooses bed at      Patient to be transferred to   on  .  Patient to be transferred to facility by       Patient family notified on   of transfer.  Name of family member notified:        PHYSICIAN       Additional Comment:    _______________________________________________ Haig ProphetMorgan, Kendra Woolford G, LCSW 05/11/2015, 2:49 PM

## 2015-05-11 NOTE — Evaluation (Addendum)
Physical Therapy Evaluation Patient Details Name: Shelley Newton MRN: [id number] DOB: [birth year] Today's Date: 05/11/2015   History of Present Illness  82 yo F presented to ED due to multiple falls and syncopal episodes. Family found pt in the floor at home. She was found to have syncope, hypoxia, closed head injury, cervical sprain, and orthostatic dizziness. PMH includes HTN, stroke, thyroid disease, GERD, and depression.  Clinical Impression  Pt demonstrates moderate generalized weakness (L weakest) and decreased functional mobility. She is oriented to person, situation and that she is in the hospital. Pt couldn't state the month/year. For bed mobility pt requires mod A and maintains seated balance with min guard for 1-2 minutes. Supine BP 147/75, HR 84 and SpO2 96% 2L. Pt c/o weakness and aching all over. She has been placed on enteric precautions for possible c-diff due to diarrhea. STR is recommended to increase pt's functional mobility and reduce caregiver burden of care. Pt will benefit from skilled PT services to increase functional I and mobility for returning to PLOF.     Follow Up Recommendations SNF    Equipment Recommendations  Other (comment) (Pt states she has recommended FWW.)    Recommendations for Other Services       Precautions / Restrictions Precautions Precautions: Fall Restrictions Weight Bearing Restrictions: No      Mobility  Bed Mobility Overal bed mobility: Needs Assistance Bed Mobility: Supine to Sit     Supine to sit: Mod assist;HOB elevated     General bed mobility comments: uses rail, difficulty moving LEs and getting trunk upright  Transfers                 General transfer comment: NT due to pt becoming nauseated and vomitting upon sitting up at EOB.  Ambulation/Gait                Stairs            Wheelchair Mobility    Modified Rankin (Stroke Patients Only)       Balance Overall balance assessment: History  of Falls;Needs assistance Sitting-balance support: Bilateral upper extremity supported Sitting balance-Leahy Scale: Fair Sitting balance - Comments: poor posture, limited tolerance, flexed trunk Postural control: Posterior lean     Standing balance comment: NT                             Pertinent Vitals/Pain Pain Assessment: 0-10 Pain Score: 5  Pain Location: "all over" Pain Descriptors / Indicators: Aching Pain Intervention(s): Limited activity within patient's tolerance;Monitored during session    Home Living Family/patient expects to be discharged to:: Private residence Living Arrangements: Children Available Help at Discharge: Family Type of Home: House Home Access: Stairs to enter Entrance Stairs-Rails: None Entrance Stairs-Number of Steps: 2 Home Layout: One level Home Equipment: Environmental consultantWalker - 4 wheels;Walker - 2 wheels Additional Comments: Lives with son; 3 daughter live on the same road.    Prior Function Level of Independence: Independent with assistive device(s)         Comments: Limited household ambulation with rollator. Assistance from family as needed. I basic ADLs.     Hand Dominance        Extremity/Trunk Assessment   Upper Extremity Assessment: Generalized weakness           Lower Extremity Assessment: Generalized weakness;LLE deficits/detail   LLE Deficits / Details: difficult to assess formally due to low activity tolerance, grossly demonstrates decreased coordination and  less strength than R side extremities; difficult to raise L UE or LE with decreased speed and accuracy  Cervical / Trunk Assessment: Kyphotic  Communication   Communication: HOH  Cognition Arousal/Alertness: Awake/alert Behavior During Therapy: Anxious Overall Cognitive Status: No family/caregiver present to determine baseline cognitive functioning                      General Comments      Exercises Other Exercises Other Exercises: B LE supine  therex: ankle pumps, heel slides and hip abd slides with AAROM x10 each. Cues for technique. Extra time required to complete due to fatigue and difficulty following commands. Therapeutic rest breaks for energy conservation. Other Exercises: Pt sits at EOB ~1-2 minutes with min guard with poor posture and flexed trunk. Becomes nauseated and returns to lying in bed with HOB elevated.      Assessment/Plan    PT Assessment Patient needs continued PT services  PT Diagnosis Difficulty walking;Generalized weakness   PT Problem List Decreased strength;Decreased activity tolerance;Decreased balance;Decreased mobility;Decreased coordination;Decreased cognition  PT Treatment Interventions Gait training;Stair training;Therapeutic activities;Therapeutic exercise;Balance training;Neuromuscular re-education;Patient/family education   PT Goals (Current goals can be found in the Care Plan section) Acute Rehab PT Goals Patient Stated Goal: To feel better. PT Goal Formulation: With patient Time For Goal Achievement: 05/25/15 Potential to Achieve Goals: Fair    Frequency Min 2X/week   Barriers to discharge Inaccessible home environment;Decreased caregiver support STE, will need 24 hour care    Co-evaluation               End of Session Equipment Utilized During Treatment: Oxygen Activity Tolerance: Patient limited by fatigue Patient left: in bed;with call bell/phone within reach;with bed alarm set;with SCD's reapplied Nurse Communication: Mobility status         Time: 1128-1201 PT Time Calculation (min) (ACUTE ONLY): 33 min   Charges:   PT Evaluation $PT Eval High Complexity: 1 Procedure PT Treatments $Therapeutic Exercise: 8-22 mins   PT G Codes:       Adelene Idler, PT, DPT  05/11/2015, 12:58 PM [phone]

## 2015-05-11 NOTE — Progress Notes (Signed)
Paged Dr. Nemiah CommanderKalisetti to advise of critical results for enterpathogenic ecoli in stool.  Orders for Cipro IV 400 BID added.  Isolation continued

## 2015-05-11 NOTE — Clinical Social Work Note (Signed)
Clinical Social Work Assessment  Patient Details  Name: Shelley Newton MRN: [id number] Date of Birth: May 04, 1922  Date of referral:  05/11/15               Reason for consult:  Facility Placement                Permission sought to share information with:  Chartered certified accountant granted to share information::  Yes, Verbal Permission Granted  Name::      [facility]   Agency::   [facility]   Relationship::     Contact Information:     Housing/Transportation Living arrangements for the past 2 months:  [place] of Information:  Patient, Power of [place], Adult Children Patient Interpreter Needed:  None Criminal Activity/Legal Involvement Pertinent to Current Situation/Hospitalization:  No - Comment as needed Significant Relationships:  Adult Children Lives with:  Adult Children Do you feel safe going back to the place where you live?  Yes Need for family participation in patient care:  Yes (Comment)  Care giving concerns: Patient lives in [place] with her son Arta Bruce [phone].     Social Worker assessment / plan:  Holiday representative (CSW) received verbal consult from PT that they are recommending SNF. CSW met with patient and her daughter Jackelyn Poling was at bedside. CSW introduced self and explained role of CSW department. Patient reported that she has 8 adult children and her son Arta Bruce is her youngest and lives with patient and takes care of her. Per daughter Donia Guiles is HPOA. CSW explained that PT is recommending SNF. Patient and daughter are agreeable to SNF search and prefer [facility]. CSW explained that patient's insurance [employer] will have to approve SNF stay.   CSW presented bed offers to patient and daughter. They chose [facility]. [facility] liaision is aware of accepted bed offer. CSW started [employer] authorization. Patient's son Jenny Reichmann is in agreement with the above plan. CSW will continue to follow and  assist as needed.   CSW received a call back from [place] case manager [phone]) who reported that she needs more information before she can approve. Per Jenny Reichmann patient got sick and was not able to get out of the bed during the initial PT eval. CSW contacted PT and requested that they see patient again this afternoon or tomorrow morning. PT has agreed. CSW will continue to follow and assist as needed.         Employment status:  Retired Nurse, adult PT Recommendations:  [place] / Referral to community resources:  [place]  Patient/Family's Response to care:  Patient and daughter are agreeable for patient to go to [facility].   Patient/Family's Understanding of and Emotional Response to Diagnosis, Current Treatment, and Prognosis:  Patient and daughter were pleasant throughout assessment and thanked CSW for visit.   Emotional Assessment Appearance:  Appears stated age Attitude/Demeanor/Rapport:    Affect (typically observed):  Accepting, Adaptable, Pleasant Orientation:  Oriented to Self, Oriented to Place, Oriented to  Time, Oriented to Situation Alcohol / Substance use:  Not Applicable Psych involvement (Current and /or in the community):  No (Comment)  Discharge Needs  Concerns to be addressed:  Discharge Planning Concerns Readmission within the last 30 days:  No Current discharge risk:  Dependent with Mobility Barriers to Discharge:  Continued Medical Work up   Loralyn Freshwater, LCSW 05/11/2015, 2:50 PM

## 2015-05-11 NOTE — Progress Notes (Signed)
[facility] [place] [facility]   PATIENT NAME: Shelley Newton    MR#:  [id number]  DATE OF BIRTH:  03-05-1922  SUBJECTIVE:  CHIEF COMPLAINT:   Chief Complaint  Patient presents with  . Fall   - admitted from house after a fall. - nauseous and vomiting and diarrhea today - daughter at bedside  REVIEW OF SYSTEMS:  Review of Systems  Constitutional: Negative for fever and chills.  HENT: Negative for ear discharge, ear pain and nosebleeds.   Eyes: Negative for blurred vision and double vision.  Respiratory: Negative for cough, shortness of breath and wheezing.   Cardiovascular: Negative for chest pain, palpitations and leg swelling.  Gastrointestinal: Positive for nausea, vomiting and diarrhea. Negative for abdominal pain and constipation.  Genitourinary: Negative for dysuria and urgency.  Musculoskeletal: Negative for myalgias.  Neurological: Positive for weakness. Negative for dizziness, sensory change, speech change, focal weakness, seizures and headaches.  Psychiatric/Behavioral: Negative for depression.    DRUG ALLERGIES:  No Known Allergies  VITALS:  Blood pressure 126/68, pulse 84, temperature 98.4 F (36.9 C), temperature source Oral, resp. rate 18, height 5\' 7"  (1.702 m), weight 68.04 kg (150 lb), SpO2 96 %.  PHYSICAL EXAMINATION:  Physical Exam  GENERAL:  82-year-old patient lying in the bed with no acute distress. No facial droop noted. EYES: Pupils equal, round, reactive to light and accommodation. No scleral icterus. Extraocular muscles intact.  HEENT: Head normocephalic. Oropharynx and nasopharynx clear.  Left parietal hematoma NECK:  Supple, no jugular venous distention. No thyroid enlargement, no tenderness.  LUNGS: Normal breath sounds bilaterally, no wheezing, rales,rhonchi or crepitation. No use of accessory muscles of respiration.  Decreased bibasilar breath sounds CARDIOVASCULAR: S1, S2 normal. No  rubs, or gallops.  3/6 systolic murmur present. ABDOMEN: Soft, nontender, nondistended. Bowel sounds present. No organomegaly or mass.  EXTREMITIES: No pedal edema, cyanosis, or clubbing.  NEUROLOGIC: Cranial nerves II through XII are intact. Muscle strength 3/5 in both upper extremities, limited arm movement. Lower extremities, very weak.. Sensation intact. Gait not checked.  PSYCHIATRIC: The patient is alert and oriented x 3.  SKIN: No obvious rash, lesion, or ulcer.    LABORATORY PANEL:   CBC  Recent Labs Lab 05/11/15 0542  WBC 6.8  HGB 11.6*  HCT 35.3  PLT 200   ------------------------------------------------------------------------------------------------------------------  Chemistries   Recent Labs Lab 05/10/15 1026 05/11/15 0542  NA 139 138  K 2.9* 4.1  CL 104 109  CO2 31 26  GLUCOSE 83 80  BUN 16 14  CREATININE 0.71 0.50  CALCIUM 7.8* 7.5*  AST 22  --   ALT 13*  --   ALKPHOS 117  --   BILITOT 1.0  --    ------------------------------------------------------------------------------------------------------------------  Cardiac Enzymes  Recent Labs Lab 05/10/15 1026  TROPONINI <0.03   ------------------------------------------------------------------------------------------------------------------  RADIOLOGY:  Dg Chest 2 View  05/10/2015  CLINICAL DATA:  Fall on bathroom floor.  Hypoxia. EXAM: CHEST - 2 VIEW COMPARISON:  Two-view chest x-ray 11/27/2012. FINDINGS: The heart is enlarged. A left pleural effusion is present. Left basilar airspace disease is noted. Aneurysmal dilation of the descending thoracic aorta is again seen. A small right pleural effusion and basilar airspace disease is also noted. Emphysema is evident. IMPRESSION: 1. Cardiomegaly and mild pulmonary vascular congestion. 2. Moderate left-sided pleural effusion and airspace disease. While this likely reflects atelectasis, infection is not excluded. 3. Small right pleural effusion and airspace disease, likely  reflecting atelectasis. 4. Stable aneurysm of  the descending thoracic aorta. Electronically Signed   By: Marin Roberts M.D.   On: 05/10/2015 16:22   Dg Shoulder Right  05/10/2015  CLINICAL DATA:  Recent fall with right shoulder pain, initial encounter EXAM: RIGHT SHOULDER - 2+ VIEW COMPARISON:  None. FINDINGS: Degenerative changes of the acromioclavicular joint are noted. No acute fracture or dislocation is seen. No gross soft tissue abnormality is noted. IMPRESSION: Degenerative change without acute abnormality. Electronically Signed   By: Alcide Clever M.D.   On: 05/10/2015 11:56   Ct Head Wo Contrast  05/10/2015  CLINICAL DATA:  Fall.  Hematoma to left posterior head. EXAM: CT HEAD WITHOUT CONTRAST CT CERVICAL SPINE WITHOUT CONTRAST TECHNIQUE: Multidetector CT imaging of the head and cervical spine was performed following the standard protocol without intravenous contrast. Multiplanar CT image reconstructions of the cervical spine were also generated. COMPARISON:  11/27/2012 FINDINGS: CT HEAD FINDINGS Sinuses/Soft tissues: Surgical changes about both globes. Left occipital scalp soft tissue swelling is moderate. Osteopenia. No skull fracture. Clear paranasal sinuses and mastoid air cells. Intracranial: Moderate to marked low density in the periventricular white matter likely related to small vessel disease. No mass lesion, hemorrhage, hydrocephalus, acute infarct, intra-axial, or extra-axial fluid collection. CT CERVICAL SPINE FINDINGS Spinal visualization through the bottom of T1. Prevertebral soft tissues are within normal limits. Bilateral carotid atherosclerosis. Biapical pleural-parenchymal scarring, without pneumothorax. Trace left pleural thickening versus less likely fluid. Skull base intact. Multilevel spondylosis, including loss of intervertebral disc height and subchondral sclerosis involving C4-5 and C5-6. Advanced facet arthropathy, especially at C3-5 on the right. Coronal reformats  demonstrate a normal C1-C2 articulation. IMPRESSION: 1. Left occipital scalp soft tissue swelling, without acute intracranial abnormality. 2. Cervical spondylosis, without acute fracture or subluxation. 3. Moderate to marked small vessel ischemic change. Electronically Signed   By: Jeronimo Greaves M.D.   On: 05/10/2015 12:04   Ct Cervical Spine Wo Contrast  05/10/2015  CLINICAL DATA:  Fall.  Hematoma to left posterior head. EXAM: CT HEAD WITHOUT CONTRAST CT CERVICAL SPINE WITHOUT CONTRAST TECHNIQUE: Multidetector CT imaging of the head and cervical spine was performed following the standard protocol without intravenous contrast. Multiplanar CT image reconstructions of the cervical spine were also generated. COMPARISON:  11/27/2012 FINDINGS: CT HEAD FINDINGS Sinuses/Soft tissues: Surgical changes about both globes. Left occipital scalp soft tissue swelling is moderate. Osteopenia. No skull fracture. Clear paranasal sinuses and mastoid air cells. Intracranial: Moderate to marked low density in the periventricular white matter likely related to small vessel disease. No mass lesion, hemorrhage, hydrocephalus, acute infarct, intra-axial, or extra-axial fluid collection. CT CERVICAL SPINE FINDINGS Spinal visualization through the bottom of T1. Prevertebral soft tissues are within normal limits. Bilateral carotid atherosclerosis. Biapical pleural-parenchymal scarring, without pneumothorax. Trace left pleural thickening versus less likely fluid. Skull base intact. Multilevel spondylosis, including loss of intervertebral disc height and subchondral sclerosis involving C4-5 and C5-6. Advanced facet arthropathy, especially at C3-5 on the right. Coronal reformats demonstrate a normal C1-C2 articulation. IMPRESSION: 1. Left occipital scalp soft tissue swelling, without acute intracranial abnormality. 2. Cervical spondylosis, without acute fracture or subluxation. 3. Moderate to marked small vessel ischemic change. Electronically  Signed   By: Jeronimo Greaves M.D.   On: 05/10/2015 12:04    EKG:   Orders placed or performed in visit on 11/27/12  . EKG 12-Lead  . EKG 12-Lead    ASSESSMENT AND PLAN:   82y/o F with PMH of CVA, hypothyroidism, HTN, depression presented with weakness and falls. Noted to  have hypokalemia on presentation  #1 Weakness and falls- PT recommended SNF - no focal weakness. CT head with no acute findings Hold off on MRI - social worker consulted - no evidence of infection- normal wbc  #2 Hypokalemia- replaced and normalized now  #3 Gastroenteritis- send stool studies, IV fluids and anti emetics Supportive trt for now  #4 HTN- continue home medications. On verapamil.  #5 GERD-on Protonix  #6 DVT prophylaxis-on subcutaneous heparin   Physical therapy consulted. daughter Ms. Debbie updated at bedside   All the records are reviewed and case discussed with Care Management/Social Workerr. Management plans discussed with the patient, family and they are in agreement.  CODE STATUS: Full Code  TOTAL TIME TAKING CARE OF THIS PATIENT: 37 minutes.   POSSIBLE D/C IN 1-2  DAYS, DEPENDING ON CLINICAL CONDITION.   Enid Baas M.D on 05/11/2015 at 3:01 PM  Between 7am to 6pm - Pager - [phone]  After 6pm go to [email or web address] - password [email or web address] [facility]  [facility]  Office  [phone]  CC: Primary care physician; No primary care provider on file.

## 2015-05-11 NOTE — NC FL2 (Signed)
[place] MEDICAID [id number] LEVEL OF CARE SCREENING TOOL     IDENTIFICATION  Patient Name: Shelley Newton Birthdate: 1922-03-02 Sex: female Admission Date (Current Location): 05/10/2015  [place] and [place] Number:  Chiropodist and Address:  [facility], [address] [place], [place] [address]      Provider Number: [id number]  Attending Physician Name and Address:  Enid Baas, MD  Relative Name and Phone Number:       Current Level of Care: Hospital Recommended Level of Care: Skilled Nursing Facility Prior Approval Number:    Date Approved/Denied:   PASRR Number:  ( [id number] )  Discharge Plan: SNF    Current Diagnoses: Patient Active Problem List   Diagnosis Date Noted  . Pressure ulcer 05/11/2015  . Syncopal episodes 05/10/2015  . Syncope 05/10/2015    Orientation RESPIRATION BLADDER Height & Weight     Self  O2 (2 Liters Oxygen ) Incontinent Weight: 150 lb (68.04 kg) Height:   (170.2 cm)  BEHAVIORAL SYMPTOMS/MOOD NEUROLOGICAL BOWEL NUTRITION STATUS   (none )  (none ) Incontinent Diet (Diet: Heart Healthy/ Carb Modified )  AMBULATORY STATUS COMMUNICATION OF NEEDS Skin   Extensive Assist Verbally PU Stage and Appropriate Care (Pressure Ulcer Stage 1: Buttocks. )                       Personal Care Assistance Level of Assistance  Bathing, Feeding, Dressing Bathing Assistance: Limited assistance Feeding assistance: Independent Dressing Assistance: Limited assistance     Functional Limitations Info  Sight, Hearing, Speech Sight Info: Adequate Hearing Info: Adequate Speech Info: Adequate    SPECIAL CARE FACTORS FREQUENCY  PT (By licensed PT)     PT Frequency:  (5)              Contractures      Additional Factors Info  Code Status, Allergies, Isolation Precautions Code Status Info:  (DNR. ) Allergies Info:  (No Known Allergies. )     Isolation Precautions Info:  (Enteric Precautions. )     Current Medications (05/11/2015):  This is the current hospital active medication list Current Facility-Administered Medications  Medication Dose Route Frequency Provider Last Rate Last Dose  . 0.9 %  sodium chloride infusion   Intravenous Continuous Altamese Dilling, MD 75 mL/hr at 05/11/15 0514    . acetaminophen (TYLENOL) tablet 650 mg  650 mg Oral Q6H PRN Oralia Manis, MD   650 mg at 05/10/15 2040  . aspirin EC tablet 81 mg  81 mg Oral Daily Altamese Dilling, MD   81 mg at 05/11/15 0900  . cyanocobalamin ((VITAMIN B-12)) injection 1,000 mcg  1,000 mcg Intramuscular Q30 days Altamese Dilling, MD   1,000 mcg at 05/10/15 1819  . donepezil (ARICEPT) tablet 10 mg  10 mg Oral Daily Altamese Dilling, MD   10 mg at 05/11/15 0900  . ferrous sulfate tablet 325 mg  325 mg Oral BID Altamese Dilling, MD   325 mg at 05/11/15 0900  . heparin injection 5,000 Units  5,000 Units Subcutaneous 3 times per day Altamese Dilling, MD   5,000 Units at 05/11/15 0514  . hydrALAZINE (APRESOLINE) injection 10 mg  10 mg Intravenous Q6H PRN Altamese Dilling, MD   10 mg at 05/10/15 1537  . [START ON 05/12/2015] Influenza vac split quadrivalent PF (FLUARIX) injection 0.5 mL  0.5 mL Intramuscular Tomorrow-1000 Altamese Dilling, MD      . levothyroxine (SYNTHROID, LEVOTHROID) tablet 125 mcg  125 mcg Oral QAC breakfast Altamese DillingVaibhavkumar Vachhani, MD   125 mcg at 05/11/15 0900  . LORazepam (ATIVAN) tablet 0.5 mg  0.5 mg Oral BID PRN Altamese DillingVaibhavkumar Vachhani, MD      . ondansetron (ZOFRAN) injection 4 mg  4 mg Intravenous Q6H PRN Enid Baasadhika Kalisetti, MD   4 mg at 05/11/15 1220  . pantoprazole (PROTONIX) EC tablet 40 mg  40 mg Oral Daily Altamese DillingVaibhavkumar Vachhani, MD   40 mg at 05/11/15 0900  . sodium chloride flush (NS) 0.9 % injection 3 mL  3 mL Intravenous Q12H Altamese DillingVaibhavkumar Vachhani, MD   3 mL at 05/10/15 2200  . verapamil (CALAN-SR) CR tablet 180 mg  180 mg Oral Daily Altamese DillingVaibhavkumar Vachhani, MD   180  mg at 05/11/15 0900  . vitamin E capsule 400 Units  400 Units Oral Daily Altamese DillingVaibhavkumar Vachhani, MD   400 Units at 05/11/15 0900     Discharge Medications: Please see discharge summary for a list of discharge medications.  Relevant Imaging Results:  Relevant Lab Results:   Additional Information  (SSN: [id number])  Haig ProphetMorgan, Analiz Tvedt G, LCSW

## 2015-05-12 DIAGNOSIS — A044 Other intestinal Escherichia coli infections: Secondary | ICD-10-CM | POA: Diagnosis not present

## 2015-05-12 LAB — BASIC METABOLIC PANEL
ANION GAP: 4 — AB (ref 5–15)
BUN: 10 mg/dL (ref 6–20)
CHLORIDE: 104 mmol/L (ref 101–111)
CO2: 26 mmol/L (ref 22–32)
Calcium: 7.6 mg/dL — ABNORMAL LOW (ref 8.9–10.3)
Creatinine, Ser: 0.57 mg/dL (ref 0.44–1.00)
GFR calc Af Amer: >60 mL/min (ref >60)
GFR calc non Af Amer: >60 mL/min (ref >60)
GLUCOSE: 80 mg/dL (ref 65–99)
POTASSIUM: 4 mmol/L (ref 3.5–5.1)
Sodium: 134 mmol/L — ABNORMAL LOW (ref 135–145)

## 2015-05-12 LAB — CBC
HEMATOCRIT: 35.6 % (ref 35.0–47.0)
Hemoglobin: 11.8 g/dL — ABNORMAL LOW (ref 12.0–16.0)
MCH: 26.3 pg (ref 26.0–34.0)
MCHC: 33.1 g/dL (ref 32.0–36.0)
MCV: 79.5 fL — AB (ref 80.0–100.0)
Platelets: 216 10*3/uL (ref 150–440)
RBC: 4.48 MIL/uL (ref 3.80–5.20)
RDW: 18.4 % — ABNORMAL HIGH (ref 11.5–14.5)
WBC: 7.8 10*3/uL (ref 3.6–11.0)

## 2015-05-12 MED ORDER — CIPROFLOXACIN HCL 500 MG PO TABS: 500.0000 mg | ORAL_TABLET | Freq: Two times a day (BID) | ORAL | Status: DC

## 2015-05-12 MED ORDER — HYDRALAZINE HCL 25 MG PO TABS: 25.0000 mg | ORAL_TABLET | Freq: Three times a day (TID) | ORAL | Status: AC

## 2015-05-12 MED ORDER — LORAZEPAM 0.5 MG PO TABS: 0.5000 mg | ORAL_TABLET | Freq: Two times a day (BID) | ORAL | Status: AC | PRN

## 2015-05-12 MED ORDER — HYDRALAZINE HCL 25 MG PO TABS
25.0000 mg | ORAL_TABLET | Freq: Three times a day (TID) | ORAL | Status: DC
  Administered 2015-05-12: 25 mg via ORAL
  Filled 2015-05-12: qty 1

## 2015-05-12 MED ORDER — TRAMADOL HCL 50 MG PO TABS: 50.0000 mg | ORAL_TABLET | Freq: Four times a day (QID) | ORAL | Status: DC | PRN

## 2015-05-12 NOTE — Progress Notes (Signed)
Physical Therapy Treatment Patient Details Name: Shelley Newton MRN: [id number] DOB: 1922/07/06 Today's Date: 05/12/2015    History of Present Illness 82 yo F presented to ED due to multiple falls and syncopal episodes. Family found pt in the floor at home. She was found to have syncope, hypoxia, closed head injury, cervical sprain, and orthostatic dizziness. PMH includes HTN, stroke, thyroid disease, GERD, and depression.    PT Comments    Patient remains globally confused at times, but follows commands with increased time for processing. Able to complete OOB activity with RW, min/mod assist for standing balance and overall safety.  Frequent cuing for task initiation, sequencing and overall safety awareness.  High fall risk due to poor balance, esp in A/P plane.  Recommend +1 physical assist at all times with mobility to maximize safety. Additional activity deferred at this time, as patient needing to finish breakfast.   Follow Up Recommendations  SNF     Equipment Recommendations       Recommendations for Other Services       Precautions / Restrictions Precautions Precautions: Fall Restrictions Weight Bearing Restrictions: No    Mobility  Bed Mobility Overal bed mobility: Needs Assistance Bed Mobility: Supine to Sit     Supine to sit: Min assist;Mod assist     General bed mobility comments: hand-over-hand to initiate and guide movement transition  Transfers Overall transfer level: Needs assistance Equipment used: Rolling walker (2 wheeled) Transfers: Sit to/from Stand Sit to Stand: Min assist;Mod assist         General transfer comment: cuing for hand placement; assist for forward weight shift and standing balance  Ambulation/Gait Ambulation/Gait assistance: Min assist Ambulation Distance (Feet): 8 Feet Assistive device: Rolling walker (2 wheeled)       General Gait Details: short, shuffling steps; min cuing for walker management; poor balance/increased  sway in A/P plane   Stairs            Wheelchair Mobility    Modified Rankin (Stroke Patients Only)       Balance Overall balance assessment: Needs assistance Sitting-balance support: No upper extremity supported;Feet supported Sitting balance-Leahy Scale: Good     Standing balance support: Bilateral upper extremity supported Standing balance-Leahy Scale: Poor                      Cognition Arousal/Alertness: Awake/alert Behavior During Therapy: WFL for tasks assessed/performed Overall Cognitive Status: History of cognitive impairments - at baseline                      Exercises Other Exercises Other Exercises: Toilet transfer, SPT with RW, min/mod assist for standing balance; sit/stand and [facility], min/mod assist; dep assist for hygiene (for management of continent and incontinent bowel)    General Comments        Pertinent Vitals/Pain Pain Assessment: No/denies pain    Home Living                      Prior Function            PT Goals (current goals can now be found in the care plan section) Acute Rehab PT Goals Patient Stated Goal: To feel better. PT Goal Formulation: With patient Time For Goal Achievement: 05/25/15 Potential to Achieve Goals: Fair Progress towards PT goals: Progressing toward goals    Frequency  Min 2X/week    PT Plan Current plan remains appropriate  Co-evaluation             End of Session Equipment Utilized During Treatment: Gait belt;Oxygen Activity Tolerance: Patient tolerated treatment well Patient left: in chair;with call bell/phone within reach;with chair alarm set;with family/visitor present     Time: 1610-9604 PT Time Calculation (min) (ACUTE ONLY): 24 min  Charges:  $Gait Training: 8-22 mins $Therapeutic Activity: 8-22 mins                    G Codes:      Trenda Corliss H. Manson Passey, PT, DPT, NCS 05/12/2015, 10:05 AM [phone]

## 2015-05-12 NOTE — Discharge Summary (Signed)
[facility] [place] [facility]   PATIENT NAME: Shelley Newton    MR#:  [id number]  DATE OF BIRTH:  [birth year]  DATE OF ADMISSION:  05/10/2015 ADMITTING PHYSICIAN: Altamese DillingVaibhavkumar Vachhani, MD  DATE OF DISCHARGE: 05/12/2015  PRIMARY CARE PHYSICIAN: No primary care provider on file.    ADMISSION DIAGNOSIS:  Hypoxia [R09.02] Closed head injury, initial encounter [S09.90XA] Cervical sprain, initial encounter [S13.4XXA] Orthostatic dizziness [R42]  DISCHARGE DIAGNOSIS:  Principal Problem:   Syncopal episodes Active Problems:   Syncope   Pressure ulcer   SECONDARY DIAGNOSIS:   Past Medical History  Diagnosis Date  . Hypertension   . Stroke (HCC)   . Thyroid disease   . GERD (gastroesophageal reflux disease)   . Depression     HOSPITAL COURSE:    82y/o F with PMH of CVA, hypothyroidism, HTN, depression presented with weakness and falls. Noted to have hypokalemia on presentation  #1 Weakness and falls- PT recommended SNF - no focal weakness. CT head with no acute findings - Child psychotherapistsocial worker consulted  #2 Hypokalemia- replaced and normalized now  #3 Gastroenteritis- stool for C. difficile is negative. Stool cultures came positive for enteral pathogens Escherichia coli. Likely cause of her weakness. Diarrhea has improved. -Started on Cipro, continue for a total of 7 days. Supportive trt for now  #4 HTN-  On verapamil. Added low-dose oral hydralazine  #5 GERD-on Prilosec as outpatient   #6 hypothyroidism-continue Synthroid  Physical therapy recommended rehabilitation. Patient will be [facility]  DISCHARGE CONDITIONS:   Stable  CONSULTS OBTAINED:   None  DRUG ALLERGIES:  No Known Allergies  DISCHARGE MEDICATIONS:   Current Discharge Medication List    START taking these medications   Details  ciprofloxacin (CIPRO) 500 MG tablet Take 1 tablet (500 mg total) by mouth 2 (two) times  daily. X 6 days Qty: 12 tablet, Refills: 0    hydrALAZINE (APRESOLINE) 25 MG tablet Take 1 tablet (25 mg total) by mouth 3 (three) times daily. Qty: 90 tablet, Refills: 2    traMADol (ULTRAM) 50 MG tablet Take 1 tablet (50 mg total) by mouth every 6 (six) hours as needed. Qty: 20 tablet, Refills: 0      CONTINUE these medications which have CHANGED   Details  LORazepam (ATIVAN) 0.5 MG tablet Take 1 tablet (0.5 mg total) by mouth 2 (two) times daily as needed. Qty: 20 tablet, Refills: 0      CONTINUE these medications which have NOT CHANGED   Details  aspirin EC 81 MG tablet Take 1 tablet by mouth daily.    Coenzyme Q10 (COQ-10) 200 MG CAPS Take 1 tablet by mouth daily.    cyanocobalamin (,VITAMIN B-12,) 1000 MCG/ML injection Inject 1 mL into the muscle every 30 (thirty) days.    donepezil (ARICEPT) 10 MG tablet Take 1 tablet by mouth daily. Refills: 5    ferrous sulfate 325 (65 FE) MG tablet Take 1 tablet by mouth 2 (two) times daily. Refills: 11    furosemide (LASIX) 20 MG tablet Take 1 tablet by mouth daily. Refills: 3    KLOR-CON 10 10 MEQ tablet Take 1 tablet by mouth daily. Refills: 3    levothyroxine (SYNTHROID, LEVOTHROID) 125 MCG tablet Take 125 mcg by mouth daily before breakfast.    omeprazole (PRILOSEC) 20 MG capsule Take 1 capsule by mouth daily.    verapamil (CALAN-SR) 180 MG CR tablet Take 1 tablet by mouth daily. Refills: 1  vitamin E 400 UNIT capsule Take 400 Units by mouth daily.         DISCHARGE INSTRUCTIONS:   1. PCP f/u in 1-2 weeks 2. Physical Therapy   If you experience worsening of your admission symptoms, develop shortness of breath, life threatening emergency, suicidal or homicidal thoughts you must seek medical attention immediately by calling 911 or calling your MD immediately  if symptoms less severe.  You Must read complete instructions/literature along with all the possible adverse reactions/side effects for all the Medicines  you take and that have been prescribed to you. Take any new Medicines after you have completely understood and accept all the possible adverse reactions/side effects.   Please note  You were cared for by a hospitalist during your hospital stay. If you have any questions about your discharge medications or the care you received while you were in the hospital after you are discharged, you can call the unit and asked to speak with the hospitalist on call if the hospitalist that took care of you is not available. Once you are discharged, your primary care physician will handle any further medical issues. Please note that NO REFILLS for any discharge medications will be authorized once you are discharged, as it is imperative that you return to your primary care physician (or establish a relationship with a primary care physician if you do not have one) for your aftercare needs so that they can reassess your need for medications and monitor your lab values.    Today   CHIEF COMPLAINT:   Chief Complaint  Patient presents with  . Fall    VITAL SIGNS:  Blood pressure 144/72, pulse 66, temperature 97.7 F (36.5 C), temperature source Oral, resp. rate 18, height  (1.702 m), weight 68.04 kg (150 lb), SpO2 94 %.  I/O:   Intake/Output Summary (Last 24 hours) at 05/12/15 1144 Last data filed at 05/11/15 2137  Gross per 24 hour  Intake 946.25 ml  Output      0 ml  Net 946.25 ml    PHYSICAL EXAMINATION:   Physical Exam  GENERAL: 82 y.o.-year-old patient sitting in the chair with no acute distress. Complaining of back pain. EYES: Pupils equal, round, reactive to light and accommodation. No scleral icterus. Extraocular muscles intact.  HEENT: Head normocephalic. Oropharynx and nasopharynx clear.  Left parietal hematoma on scalp NECK: Supple, no jugular venous distention. No thyroid enlargement, no tenderness.  LUNGS: Normal breath sounds bilaterally, no wheezing, rales,rhonchi or  crepitation. No use of accessory muscles of respiration.  Decreased bibasilar breath sounds CARDIOVASCULAR: S1, S2 normal. No rubs, or gallops. 3/6 systolic murmur present. ABDOMEN: Soft, nontender, nondistended. Bowel sounds present. No organomegaly or mass.  EXTREMITIES: No pedal edema, cyanosis, or clubbing.  NEUROLOGIC: Cranial nerves II through XII are intact. Muscle strength 3/5 in both upper extremities, limited arm movement. Lower extremities, very weak.. Sensation intact. Gait not checked.  PSYCHIATRIC: The patient is alert and oriented x 2-3.  SKIN: No obvious rash, lesion, or ulcer.   DATA REVIEW:   CBC  Recent Labs Lab 05/12/15 0509  WBC 7.8  HGB 11.8*  HCT 35.6  PLT 216    Chemistries   Recent Labs Lab 05/10/15 1026  05/12/15 0509  NA 139  < > 134*  K 2.9*  < > 4.0  CL 104  < > 104  CO2 31  < > 26  GLUCOSE 83  < > 80  BUN 16  < > 10  CREATININE 0.71  < > 0.57  CALCIUM 7.8*  < > 7.6*  AST 22  --   --   ALT 13*  --   --   ALKPHOS 117  --   --   BILITOT 1.0  --   --   < > = values in this interval not displayed.  Cardiac Enzymes  Recent Labs Lab 05/10/15 1026  TROPONINI <0.03    Microbiology Results  Results for orders placed or performed during the hospital encounter of 05/10/15  Gastrointestinal Panel by PCR , Stool     Status: Abnormal   Collection Time: 05/11/15 12:20 PM  Result Value Ref Range Status   Campylobacter species NOT DETECTED NOT DETECTED Final   Plesimonas shigelloides NOT DETECTED NOT DETECTED Final   Salmonella species NOT DETECTED NOT DETECTED Final   Yersinia enterocolitica NOT DETECTED NOT DETECTED Final   Vibrio species NOT DETECTED NOT DETECTED Final   Vibrio cholerae NOT DETECTED NOT DETECTED Final   Enteroaggregative E coli (EAEC) NOT DETECTED NOT DETECTED Final   Enteropathogenic E coli (EPEC) DETECTED (A) NOT DETECTED Final    Comment: CRITICAL RESULT CALLED TO, READ BACK BY AND VERIFIED WITH: KIM SMITH AT  1554 ON 05/11/15. CTJ    Enterotoxigenic E coli (ETEC) NOT DETECTED NOT DETECTED Final   Shiga like toxin producing E coli (STEC) NOT DETECTED NOT DETECTED Final   E. coli O157 NOT DETECTED NOT DETECTED Final   Shigella/Enteroinvasive E coli (EIEC) NOT DETECTED NOT DETECTED Final   Cryptosporidium NOT DETECTED NOT DETECTED Final   Cyclospora cayetanensis NOT DETECTED NOT DETECTED Final   Entamoeba histolytica NOT DETECTED NOT DETECTED Final   Giardia lamblia NOT DETECTED NOT DETECTED Final   Adenovirus F40/41 NOT DETECTED NOT DETECTED Final   Astrovirus NOT DETECTED NOT DETECTED Final   Norovirus GI/GII NOT DETECTED NOT DETECTED Final   Rotavirus A NOT DETECTED NOT DETECTED Final   Sapovirus (I, II, IV, and V) NOT DETECTED NOT DETECTED Final  C difficile quick scan w PCR reflex     Status: None   Collection Time: 05/11/15 12:20 PM  Result Value Ref Range Status   C Diff antigen NEGATIVE NEGATIVE Final   C Diff toxin NEGATIVE NEGATIVE Final   C Diff interpretation Negative for C. difficile  Final    RADIOLOGY:  Dg Chest 2 View  05/10/2015  CLINICAL DATA:  Fall on bathroom floor.  Hypoxia. EXAM: CHEST - 2 VIEW COMPARISON:  Two-view chest x-ray 11/27/2012. FINDINGS: The heart is enlarged. A left pleural effusion is present. Left basilar airspace disease is noted. Aneurysmal dilation of the descending thoracic aorta is again seen. A small right pleural effusion and basilar airspace disease is also noted. Emphysema is evident. IMPRESSION: 1. Cardiomegaly and mild pulmonary vascular congestion. 2. Moderate left-sided pleural effusion and airspace disease. While this likely reflects atelectasis, infection is not excluded. 3. Small right pleural effusion and airspace disease, likely reflecting atelectasis. 4. 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 Electronically Signed   By: Marin Roberts M.D.   On: 05/10/2015 16:22   Dg Shoulder Right  05/10/2015  CLINICAL DATA:  Recent fall  with right shoulder pain, initial encounter EXAM: RIGHT SHOULDER - 2+ VIEW COMPARISON:  None. FINDINGS: Degenerative changes of the acromioclavicular joint are noted. No acute fracture or dislocation is seen. No gross soft tissue abnormality is noted. IMPRESSION: Degenerative change without acute abnormality. Electronically Signed   By: Alcide Clever M.D.   On: 05/10/2015  11:56   Ct Head Wo Contrast  05/10/2015  CLINICAL DATA:  Fall.  Hematoma to left posterior head. EXAM: CT HEAD WITHOUT CONTRAST CT CERVICAL SPINE WITHOUT CONTRAST TECHNIQUE: Multidetector CT imaging of the head and cervical spine was performed following the standard protocol without intravenous contrast. Multiplanar CT image reconstructions of the cervical spine were also generated. COMPARISON:  11/27/2012 FINDINGS: CT HEAD FINDINGS Sinuses/Soft tissues: Surgical changes about both globes. Left occipital scalp soft tissue swelling is moderate. Osteopenia. No skull fracture. Clear paranasal sinuses and mastoid air cells. Intracranial: Moderate to marked low density in the periventricular white matter likely related to small vessel disease. No mass lesion, hemorrhage, hydrocephalus, acute infarct, intra-axial, or extra-axial fluid collection. CT CERVICAL SPINE FINDINGS Spinal visualization through the bottom of T1. Prevertebral soft tissues are within normal limits. Bilateral carotid atherosclerosis. Biapical pleural-parenchymal scarring, without pneumothorax. Trace left pleural thickening versus less likely fluid. Skull base intact. Multilevel spondylosis, including loss of intervertebral disc height and subchondral sclerosis involving C4-5 and C5-6. Advanced facet arthropathy, especially at C3-5 on the right. Coronal reformats demonstrate a normal C1-C2 articulation. IMPRESSION: 1. Left occipital scalp soft tissue swelling, without acute intracranial abnormality. 2. Cervical spondylosis, without acute fracture or subluxation. 3. Moderate to  marked small vessel ischemic change. Electronically Signed   By: Jeronimo Greaves M.D.   On: 05/10/2015 12:04   Ct Cervical Spine Wo Contrast  05/10/2015  CLINICAL DATA:  Fall.  Hematoma to left posterior head. EXAM: CT HEAD WITHOUT CONTRAST CT CERVICAL SPINE WITHOUT CONTRAST TECHNIQUE: Multidetector CT imaging of the head and cervical spine was performed following the standard protocol without intravenous contrast. Multiplanar CT image reconstructions of the cervical spine were also generated. COMPARISON:  11/27/2012 FINDINGS: CT HEAD FINDINGS Sinuses/Soft tissues: Surgical changes about both globes. Left occipital scalp soft tissue swelling is moderate. Osteopenia. No skull fracture. Clear paranasal sinuses and mastoid air cells. Intracranial: Moderate to marked low density in the periventricular white matter likely related to small vessel disease. No mass lesion, hemorrhage, hydrocephalus, acute infarct, intra-axial, or extra-axial fluid collection. CT CERVICAL SPINE FINDINGS Spinal visualization through the bottom of T1. Prevertebral soft tissues are within normal limits. Bilateral carotid atherosclerosis. Biapical pleural-parenchymal scarring, without pneumothorax. Trace left pleural thickening versus less likely fluid. Skull base intact. Multilevel spondylosis, including loss of intervertebral disc height and subchondral sclerosis involving C4-5 and C5-6. Advanced facet arthropathy, especially at C3-5 on the right. Coronal reformats demonstrate a normal C1-C2 articulation. IMPRESSION: 1. Left occipital scalp soft tissue swelling, without acute intracranial abnormality. 2. Cervical spondylosis, without acute fracture or subluxation. 3. Moderate to marked small vessel ischemic change. Electronically Signed   By: Jeronimo Greaves M.D.   On: 05/10/2015 12:04    EKG:   Orders placed or performed in visit on 11/27/12  . EKG 12-Lead  . EKG 12-Lead      Management plans discussed with the patient, family and  they are in agreement.  CODE STATUS:     Code Status Orders        Start     Ordered   05/10/15 1630  Do not attempt resuscitation (DNR)   Continuous    Question Answer Comment  In the event of cardiac or respiratory ARREST Do not call a "code blue"   In the event of cardiac or respiratory ARREST Do not perform Intubation, CPR, defibrillation or ACLS   In the event of cardiac or respiratory ARREST Use medication by any route, position, wound care, and other  measures to relive pain and suffering. May use oxygen, suction and manual treatment of airway obstruction as needed for comfort.   Comments confirmed with son      05/10/15 1629    Code Status History    Date Active Date Inactive Code Status Order ID Comments User Context   This patient has a current code status but no historical code status.    Advance Directive Documentation        Most Recent Value   Type of Advance Directive  Healthcare Power of Attorney   Pre-existing out of facility DNR order (yellow form or pink MOST form)     "MOST" Form in Place?        TOTAL TIME TAKING CARE OF THIS PATIENT: 38 minutes.    Enid Baas M.D on 05/12/2015 at 11:44 AM  Between 7am to 6pm - Pager - [phone]  After 6pm go to [email or web address] - password [email or web address] [facility]  [facility]  Office  [phone]  CC: Primary care physician; No primary care provider on file.

## 2015-05-12 NOTE — Clinical Social Work Placement (Signed)
   CLINICAL SOCIAL WORK PLACEMENT  NOTE  Date:  05/12/2015  Patient Details  Name: Shelley Newton MRN: [id number] Date of Birth: 07/15/1922  Clinical Social Work is seeking post-discharge placement for this patient at the Skilled  Nursing Facility level of care (*CSW will initial, date and re-position this form in  chart as items are completed):  Yes   Patient/family provided with [facility] Clinical Social Work Department's list of facilities offering this level of care within the geographic area requested by the patient (or if unable, by the patient's family).  Yes   Patient/family informed of their freedom to choose among providers that offer the needed level of care, that participate in Medicare, Medicaid or managed care program needed by the patient, have an available bed and are willing to accept the patient.  Yes   Patient/family informed of [facility]'s ownership interest in [facility] and [facility], as well as of the fact that they are under no obligation to receive care at these facilities.  PASRR submitted to EDS on       PASRR number received on       Existing PASRR number confirmed on 05/11/15     FL2 transmitted to all facilities in geographic area requested by pt/family on 05/11/15     FL2 transmitted to all facilities within larger geographic area on       Patient informed that his/her managed care company has contracts with or will negotiate with certain facilities, including the following:        Yes   Patient/family informed of bed offers received.  Patient chooses bed at  ([facility] )     Physician recommends and patient chooses bed at      Patient to be transferred to  ([facility] ) on 05/12/15.  Patient to be transferred to facility by  [facility]     Patient family notified on 05/12/15 of transfer.  Name of family member notified:   (Patient's son Casimiro NeedleMichael is aware of D/C today. )     PHYSICIAN       Additional Comment:     _______________________________________________ Haig ProphetMorgan, Shown Dissinger G, LCSW 05/12/2015, 2:09 PM

## 2015-05-12 NOTE — Progress Notes (Signed)
Pt discharged to [facility] this afternoon. Son to accompany with belongings. Reviewed d/c instructions, follow ups and meds with pt. Report called to [facility]. Transported by EMS.

## 2015-05-12 NOTE — Care Management Important Message (Signed)
Important Message  Patient Details  Name: Shelley Newton MRN: [id number] Date of Birth: 10/24/1922   Medicare Important Message Given:  Yes    Collie SiadAngela Tametria Aho, RN 05/12/2015, 9:14 AM

## 2015-05-12 NOTE — [place] FL2 (Signed)
[place] MEDICAID FL2 LEVEL OF CARE SCREENING TOOL     IDENTIFICATION  Patient Name: Shelley Newton Birthdate: 12-May-1922 Sex: female Admission Date (Current Location): 05/10/2015  [place]   Facility and Address:  [facility], [address], [place], [place] [address]      Provider Number: [id number]  Attending Physician Name and Address:  Enid Baasadhika Kalisetti, MD  Relative Name and Phone Number:       Current Level of Care: Hospital Recommended Level of Care: Skilled Nursing Facility Prior Approval Number:    Date Approved/Denied:   PASRR Number:  ( [id number] )  Discharge Plan: SNF    Current Diagnoses: Patient Active Problem List   Diagnosis Date Noted  . Pressure ulcer 05/11/2015  . Syncopal episodes 05/10/2015  . Syncope 05/10/2015    Orientation RESPIRATION BLADDER Height & Weight     Self  O2 (2 Liters Oxygen ) Incontinent Weight: 150 lb (68.04 kg) Height:  5\' 7"  (170.2 cm)  BEHAVIORAL SYMPTOMS/MOOD NEUROLOGICAL BOWEL NUTRITION STATUS   (none )  (none ) Incontinent Diet (Diet: Heart Healthy/ Carb Modified )  AMBULATORY STATUS COMMUNICATION OF NEEDS Skin   Extensive Assist Verbally PU Stage and Appropriate Care (Pressure Ulcer Stage 1: Buttocks. )                       Personal Care Assistance Level of Assistance  Bathing, Feeding, Dressing Bathing Assistance: Limited assistance Feeding assistance: Independent Dressing Assistance: Limited assistance     Functional Limitations Info  Sight, Hearing, Speech Sight Info: Adequate Hearing Info: Adequate Speech Info: Adequate    SPECIAL CARE FACTORS FREQUENCY  PT (By licensed PT)     PT Frequency:  (5)              Contractures      Additional Factors Info  Code Status, Allergies, Isolation Precautions Code Status Info:  (DNR. ) Allergies Info:  (No Known Allergies. )     Isolation Precautions Info:  (Enteric Precautions. )     Current Medications (05/12/2015):  This is the current hospital active medication list Current Facility-Administered Medications  Medication Dose Route Frequency Provider Last Rate Last Dose  . 0.9 %  sodium chloride infusion   Intravenous Continuous Altamese DillingVaibhavkumar Vachhani, MD 75 mL/hr at 05/12/15 0330    . acetaminophen (TYLENOL) tablet 650 mg  650 mg Oral Q6H PRN Oralia Manisavid Willis, MD   650 mg at 05/12/15 1000  . aspirin EC tablet 81 mg  81 mg Oral Daily Altamese DillingVaibhavkumar Vachhani, MD   81 mg at 05/12/15 1000  . ciprofloxacin (CIPRO) IVPB 400 mg  400 mg Intravenous Q12H Enid Baasadhika Kalisetti, MD   400 mg at 05/12/15 0400  . cyanocobalamin ((VITAMIN B-12)) injection 1,000 mcg  1,000 mcg Intramuscular Q30 days Altamese DillingVaibhavkumar Vachhani, MD   1,000 mcg at 05/10/15 1819  . donepezil (ARICEPT) tablet 10 mg  10 mg Oral Daily Altamese DillingVaibhavkumar Vachhani, MD   10 mg at 05/12/15 1000  . ferrous sulfate tablet 325 mg  325 mg Oral BID Altamese DillingVaibhavkumar Vachhani, MD   325 mg at 05/12/15 1000  . heparin injection 5,000 Units  5,000 Units Subcutaneous 3 times per day Altamese DillingVaibhavkumar Vachhani, MD   5,000 Units at 05/12/15 0554  . hydrALAZINE (APRESOLINE) injection 10 mg  10 mg Intravenous Q6H PRN Altamese DillingVaibhavkumar Vachhani, MD   10 mg at 05/10/15 1537  . hydrALAZINE (APRESOLINE) tablet 25 mg  25 mg Oral TID  Enid Baas, MD   25 mg at 05/12/15 1000  . levothyroxine (SYNTHROID, LEVOTHROID) tablet 125 mcg  125 mcg Oral QAC breakfast Altamese Dilling, MD   125 mcg at 05/11/15 0900  . LORazepam (ATIVAN) tablet 0.5 mg  0.5 mg Oral BID PRN Altamese Dilling, MD      . ondansetron (ZOFRAN) injection 4 mg  4 mg Intravenous Q6H PRN Enid Baas, MD   4 mg at 05/11/15 1220  . pantoprazole (PROTONIX) EC tablet 40 mg  40 mg Oral Daily Altamese Dilling, MD   40 mg at 05/12/15 1000  . sodium chloride flush (NS) 0.9 % injection 3 mL  3 mL Intravenous Q12H Altamese Dilling, MD   3 mL at 05/12/15 1000  . verapamil (CALAN-SR) CR  tablet 180 mg  180 mg Oral Daily Altamese Dilling, MD   180 mg at 05/12/15 1000  . vitamin E capsule 400 Units  400 Units Oral Daily Altamese Dilling, MD   400 Units at 05/12/15 1000     Discharge Medications: Please see discharge summary for a list of discharge medications.  Relevant Imaging Results:  Relevant Lab Results:   Additional Information  (SSN: [id number])  Haig Prophet, LCSW

## 2015-05-12 NOTE — Progress Notes (Signed)
Patient is medically stable for D/C to [facility] today. Per Jomarie Longs[facility] liaison patient will go to room 501. RN will call report to RN Irena Cordshristy Cheek and arrange EMS for transport. [facility]. Auth # [id number]. Clinical Child psychotherapistocial Worker (CSW) sent D/C Summary, FL2 and D/C Packet to [employer] via [employer]. CSW contacted patient's son Jonny RuizJohn and made him aware of above. Per Jonny RuizJohn he is going to [facility] to complete admissions paper work today. Please reconsult if future social work needs arise. CSW signing off.   Jetta LoutBailey Morgan, LCSW [phone]

## 2015-11-22 ENCOUNTER — Ambulatory Visit
Admission: RE | Admit: 2015-11-22 | Discharge: 2015-11-22 | Disposition: A | Discharge status: Home / Self Care | Payer: Medicare Other | Source: Ambulatory Visit | Attending: Infectious Diseases | Admitting: Infectious Diseases

## 2015-11-22 ENCOUNTER — Other Ambulatory Visit: Payer: Self-pay | Admitting: Infectious Diseases

## 2015-11-22 DIAGNOSIS — G452 Multiple and bilateral precerebral artery syndromes: Secondary | ICD-10-CM | POA: Insufficient documentation

## 2015-11-22 DIAGNOSIS — I672 Cerebral atherosclerosis: Secondary | ICD-10-CM | POA: Diagnosis not present

## 2015-11-22 DIAGNOSIS — G319 Degenerative disease of nervous system, unspecified: Secondary | ICD-10-CM | POA: Diagnosis not present

## 2015-12-13 ENCOUNTER — Emergency Department
Admission: EM | Admit: 2015-12-13 | Discharge: 2015-12-13 | Disposition: A | Discharge status: Other Acute Inpatient Hospital | Payer: Medicare Other | Attending: Emergency Medicine | Admitting: Emergency Medicine

## 2015-12-13 ENCOUNTER — Emergency Department: Payer: Medicare Other

## 2015-12-13 DIAGNOSIS — S52122A Displaced fracture of head of left radius, initial encounter for closed fracture: Secondary | ICD-10-CM | POA: Diagnosis not present

## 2015-12-13 DIAGNOSIS — I10 Essential (primary) hypertension: Secondary | ICD-10-CM | POA: Diagnosis not present

## 2015-12-13 DIAGNOSIS — Y999 Unspecified external cause status: Secondary | ICD-10-CM | POA: Insufficient documentation

## 2015-12-13 DIAGNOSIS — Z79899 Other long term (current) drug therapy: Secondary | ICD-10-CM | POA: Diagnosis not present

## 2015-12-13 DIAGNOSIS — Y92009 Unspecified place in unspecified non-institutional (private) residence as the place of occurrence of the external cause: Secondary | ICD-10-CM | POA: Insufficient documentation

## 2015-12-13 DIAGNOSIS — Y939 Activity, unspecified: Secondary | ICD-10-CM | POA: Insufficient documentation

## 2015-12-13 DIAGNOSIS — S0990XA Unspecified injury of head, initial encounter: Secondary | ICD-10-CM | POA: Diagnosis present

## 2015-12-13 DIAGNOSIS — Z87891 Personal history of nicotine dependence: Secondary | ICD-10-CM | POA: Insufficient documentation

## 2015-12-13 DIAGNOSIS — S065X9A Traumatic subdural hemorrhage with loss of consciousness of unspecified duration, initial encounter: Secondary | ICD-10-CM

## 2015-12-13 DIAGNOSIS — W19XXXA Unspecified fall, initial encounter: Secondary | ICD-10-CM | POA: Insufficient documentation

## 2015-12-13 DIAGNOSIS — S065X0A Traumatic subdural hemorrhage without loss of consciousness, initial encounter: Secondary | ICD-10-CM | POA: Diagnosis not present

## 2015-12-13 DIAGNOSIS — Z7982 Long term (current) use of aspirin: Secondary | ICD-10-CM | POA: Insufficient documentation

## 2015-12-13 DIAGNOSIS — S065XAA Traumatic subdural hemorrhage with loss of consciousness status unknown, initial encounter: Secondary | ICD-10-CM

## 2015-12-13 HISTORY — DX: Unspecified dementia, unspecified severity, without behavioral disturbance, psychotic disturbance, mood disturbance, and anxiety: F03.90

## 2015-12-13 LAB — URINALYSIS COMPLETE WITH MICROSCOPIC (ARMC ONLY)
Bilirubin Urine: NEGATIVE
GLUCOSE, UA: NEGATIVE mg/dL
Hgb urine dipstick: NEGATIVE
Ketones, ur: NEGATIVE mg/dL
Nitrite: NEGATIVE
PROTEIN: NEGATIVE mg/dL
Specific Gravity, Urine: 1.014 (ref 1.005–1.030)
pH: 5 (ref 5.0–8.0)

## 2015-12-13 LAB — BASIC METABOLIC PANEL
ANION GAP: 8 (ref 5–15)
BUN: 19 mg/dL (ref 6–20)
CHLORIDE: 105 mmol/L (ref 101–111)
CO2: 27 mmol/L (ref 22–32)
Calcium: 7.6 mg/dL — ABNORMAL LOW (ref 8.9–10.3)
Creatinine, Ser: 0.73 mg/dL (ref 0.44–1.00)
Glucose, Bld: 95 mg/dL (ref 65–99)
POTASSIUM: 3.3 mmol/L — AB (ref 3.5–5.1)
SODIUM: 140 mmol/L (ref 135–145)

## 2015-12-13 LAB — CBC WITH DIFFERENTIAL/PLATELET
BASOS ABS: 0.1 10*3/uL (ref 0–0.1)
Basophils Relative: 0 %
EOS PCT: 0 %
Eosinophils Absolute: 0 10*3/uL (ref 0–0.7)
HCT: 38.8 % (ref 35.0–47.0)
HEMOGLOBIN: 13 g/dL (ref 12.0–16.0)
LYMPHS PCT: 14 %
Lymphs Abs: 1.7 10*3/uL (ref 1.0–3.6)
MCH: 28.3 pg (ref 26.0–34.0)
MCHC: 33.5 g/dL (ref 32.0–36.0)
MCV: 84.6 fL (ref 80.0–100.0)
Monocytes Absolute: 1 10*3/uL — ABNORMAL HIGH (ref 0.2–0.9)
Monocytes Relative: 8 %
NEUTROS ABS: 9.8 10*3/uL — AB (ref 1.4–6.5)
NEUTROS PCT: 78 %
PLATELETS: 277 10*3/uL (ref 150–440)
RBC: 4.58 MIL/uL (ref 3.80–5.20)
RDW: 14.5 % (ref 11.5–14.5)
WBC: 12.7 10*3/uL — AB (ref 3.6–11.0)

## 2015-12-13 LAB — TROPONIN I: Troponin I: <0.03 ng/mL (ref <0.03)

## 2015-12-13 NOTE — ED Notes (Signed)
[facility]

## 2015-12-13 NOTE — ED Notes (Signed)
Patient transported to X-ray 

## 2015-12-13 NOTE — ED Triage Notes (Signed)
Pt from home via EMS, reports she fell 3 days ago, present today with swelling to L arm and legs, unable to move left arm.

## 2015-12-13 NOTE — ED Provider Notes (Signed)
[facility] Emergency Department Provider Note ____________________________________________   I have reviewed the triage vital signs and the triage nursing note.  HISTORY  Chief Complaint Fall   Historian  Level 5 caveat - dementia, poor historian History from patient's friend who sits with her on tuesdays and thursdays, here at the bedside  HPI OLUWAKEMI SALSBERRY is a 82 y.o. female who has dementia, lives at a home with her son, brought in at request of friend who noted patient had swelling to left arm and pain at left elbow.  Reportedly had ?unwitnessed? fall at home on Saturday.  Apparently his son had reported to the find that the left knee was swollen and it looks a little bit better now. Today the patient is complaining of left elbow pain. She does not know what happened.  Symptoms are moderate.  She states she does not walk on her own.    Past Medical History:  Diagnosis Date  . Dementia   . Depression   . GERD (gastroesophageal reflux disease)   . Hypertension   . Stroke (HCC)   . Thyroid disease     Patient Active Problem List   Diagnosis Date Noted  . Pressure ulcer 05/11/2015  . Syncopal episodes 05/10/2015  . Syncope 05/10/2015    Past Surgical History:  Procedure Laterality Date  . ABDOMINAL HYSTERECTOMY      Prior to Admission medications   Medication Sig Start Date End Date Taking? Authorizing Provider  aspirin EC 81 MG tablet Take 1 tablet by mouth daily.    Historical Provider, MD  ciprofloxacin (CIPRO) 500 MG tablet Take 1 tablet (500 mg total) by mouth 2 (two) times daily. X 6 days 05/12/15   Enid Baas, MD  Coenzyme Q10 (COQ-10) 200 MG CAPS Take 1 tablet by mouth daily.    Historical Provider, MD  cyanocobalamin (,VITAMIN B-12,) 1000 MCG/ML injection Inject 1 mL into the muscle every 30 (thirty) days. 02/03/15   Historical Provider, MD  donepezil (ARICEPT) 10 MG tablet Take 1 tablet by mouth daily. 04/24/15   Historical  Provider, MD  ferrous sulfate 325 (65 FE) MG tablet Take 1 tablet by mouth 2 (two) times daily. 04/24/15   Historical Provider, MD  furosemide (LASIX) 20 MG tablet Take 1 tablet by mouth daily. 04/21/15   Historical Provider, MD  hydrALAZINE (APRESOLINE) 25 MG tablet Take 1 tablet (25 mg total) by mouth 3 (three) times daily. 05/12/15   Enid Baas, MD  KLOR-CON 10 10 MEQ tablet Take 1 tablet by mouth daily. 02/28/15   Historical Provider, MD  levothyroxine (SYNTHROID, LEVOTHROID) 125 MCG tablet Take 125 mcg by mouth daily before breakfast.    Historical Provider, MD  LORazepam (ATIVAN) 0.5 MG tablet Take 1 tablet (0.5 mg total) by mouth 2 (two) times daily as needed. 05/12/15   Enid Baas, MD  omeprazole (PRILOSEC) 20 MG capsule Take 1 capsule by mouth daily. 07/09/14   Historical Provider, MD  traMADol (ULTRAM) 50 MG tablet Take 1 tablet (50 mg total) by mouth every 6 (six) hours as needed. 05/12/15   Enid Baas, MD  verapamil (CALAN-SR) 180 MG CR tablet Take 1 tablet by mouth daily. 04/21/15   Historical Provider, MD  vitamin E 400 UNIT capsule Take 400 Units by mouth daily.    Historical Provider, MD    No Known Allergies  Family History  Problem Relation Age of Onset  . Diabetes Brother     Social History Social History  Substance  Use Topics  . Smoking status: Former Games developermoker  . Smokeless tobacco: Never Used  . Alcohol use No    Review of Systems  Constitutional: Negative for any reported recent illnesses. Eyes: Negative for visual changes. ENT: Negative for sore throat. Cardiovascular: Negative for chest pain. Respiratory: Negative for shortness of breath. Gastrointestinal: Negative for abdominal pain, vomiting and diarrhea. Genitourinary: Negative for dysuria. Musculoskeletal: Negative for back pain. Skin: Negative for rash. Neurological: Negative for headache. 10 point Review of Systems otherwise  negative ____________________________________________   PHYSICAL EXAM:  VITAL SIGNS: ED Triage Vitals  Enc Vitals Group     BP 12/13/15 1111 (!) 160/89     Pulse Rate 12/13/15 1111 95     Resp 12/13/15 1111 16     Temp 12/13/15 1111 98.4 F (36.9 C)     Temp src --      SpO2 12/13/15 1111 93 %     Weight 12/13/15 1107 120 lb (54.4 kg)     Height 12/13/15 1107 5\' 1"  (1.549 m)     Head Circumference --      [facility] Flow --      Pain Score --      Pain Loc --      Pain Edu? --      Excl. in GC? --      Constitutional: Alert and cooperative, but disoriented. Well appearing and in no distress. HEENT   Head: Normocephalic and atraumatic.      Eyes: Conjunctivae are normal. PERRL. Normal extraocular movements.      Ears:         Nose: No congestion/rhinnorhea.   Mouth/Throat: Mucous membranes are moist.   Neck: No stridor.  No cervical Step-off, but she does have some tenderness to palpation at the posterior inferior C-spine Cardiovascular/Chest: Normal rate, regular rhythm.  No murmurs, rubs, or gallops. Respiratory: Normal respiratory effort without tachypnea nor retractions. Breath sounds are clear and equal bilaterally. No wheezes/rales/rhonchi. Gastrointestinal: Soft. No distention, no guarding, no rebound. Nontender.    Genitourinary/rectal:Deferred Musculoskeletal: both hips hurt with passive rom.  Pelvis stable.  Left elbow down to hand with moderate edema, and tenderness at the elbow. There is some swelling to the left knee with mild tenderness to movement. neurovascularly intact in 4 extremities. Neurologic:  No facial droop, no slurred speech. Limited movement in the left upper extremity due to pain, I do not appreciate a focal weakness or numbness from neurologic standpoint. Skin:  Skin is warm, dry and intact. No rash noted. Psychiatric: Cooperative, no agitation.   ____________________________________________  LABS (pertinent positives/negatives)  Labs  Reviewed  URINALYSIS COMPLETEWITH MICROSCOPIC ([facility] ONLY) - Abnormal; Notable for the following:       Result Value   Color, Urine YELLOW (*)    APPearance CLEAR (*)    Leukocytes, UA TRACE (*)    Bacteria, UA RARE (*)    Squamous Epithelial / LPF 0-5 (*)    All other components within normal limits  BASIC METABOLIC PANEL - Abnormal; Notable for the following:    Potassium 3.3 (*)    Calcium 7.6 (*)    All other components within normal limits  CBC WITH DIFFERENTIAL/PLATELET - Abnormal; Notable for the following:    WBC 12.7 (*)    Neutro Abs 9.8 (*)    Monocytes Absolute 1.0 (*)    All other components within normal limits  TROPONIN I    ____________________________________________    EKG I, Governor Rooksebecca Mirko Tailor, MD, the attending physician  have personally viewed and interpreted all ECGs.  89 bpm. Normal sinus rhythm. Nonspecific intraventricular conduction delay. Left axis deviation. Nonspecific ST and T-wave ____________________________________________  RADIOLOGY All Xrays were viewed by me. Imaging interpreted by Radiologist.  CT head and cervical spine non contrast:  IMPRESSION: CT HEAD  Broad-based right convexity subdural hematoma measuring up to 3.5 mm.  No skull fracture detected.  Moderate chronic microvascular changes without CT evidence of large acute infarct.  CT CERVICAL SPINE  Mild anterior slip C3 and C6 without change.  No cervical spine fracture.  Cervical spondylotic changes with facet degenerative changes most notable on the right at the C4-5 level and on the left at the C6-7 level. Various degrees of spinal stenosis and foraminal narrowing with spinal stenosis most prominent C4-5 thru C6-7.  These results were called by telephone at the time of interpretation on 12/13/2015 at 12:39 pm to Dr. Governor Rooks , who verbally acknowledged these results.  Xrays -  Left knee complete: negative  Pelvis : negative Elbow left:   IMPRESSION: Large joint effusion with suggestion of undisplaced radial head Fracture.  Chest: stable left basilar atectasis and effusion.        __________________________________________  PROCEDURES  Procedure(s) performed: None  Critical Care performed: None  ____________________________________________   ED COURSE / ASSESSMENT AND PLAN  Pertinent labs & imaging results that were available during my care of the patient were reviewed by me and considered in my medical decision making (see chart for details).  Patient brought in by the friend after a fall which was unwitnessed a few nights ago, and mostly complaining of left knee and left elbow pain, but she is demented, and unclear she's had altered mental status.  From a traumatic standpoint, knee, hips, and chest x-rays are reassuring. CT C-spine negative for fracture or traumatic injury.  She does have suspicious findings for radial head fracture and after discussion with orthopedic physician, recommended posterior splinting.  She does have a small subdural hematoma. It is likely stable at this point in time given the onset was probably Saturday when the fall was reported, however didn't given the uncertainty, and no neurosurgeon here for consultation, patient will be transferred. I discussed family preference, and they chose [facility].  Patient accepted to [facility], will transport by [facility].    CONSULTATIONS:   Dr. Ernest Pine, ortho - recommends posterior splint in acute setting, ortho outpatient follow up.  Dr. Cira Servant of [facility] trauma surgery accepts to ED, I spoke with transfer center.   Patient / Family / Caregiver informed of clinical course, medical decision-making process, and agree with plan.   ___________________________________________   FINAL CLINICAL IMPRESSION(S) / ED DIAGNOSES   Final diagnoses:  Subdural hematoma (HCC)  Closed displaced fracture of head of left radius, initial encounter               Note: This dictation was prepared with Dragon dictation. Any transcriptional errors that result from this process are unintentional    Governor Rooks, MD 12/13/15 1424

## 2015-12-28 DEATH — deceased

## 2017-05-02 IMAGING — CR DG CHEST 2V
1 series · 2 of 2 positions shown · non-contrast
Comparison: Two-view chest x-ray 11/27/2012.

CLINICAL DATA: Fall on bathroom floor.  Hypoxia.

EXAM:
CHEST - 2 VIEW

[Series 1: dg chest 2 view · 0.14mm/px · 2 of 2 slices shown]
[im 1/2]
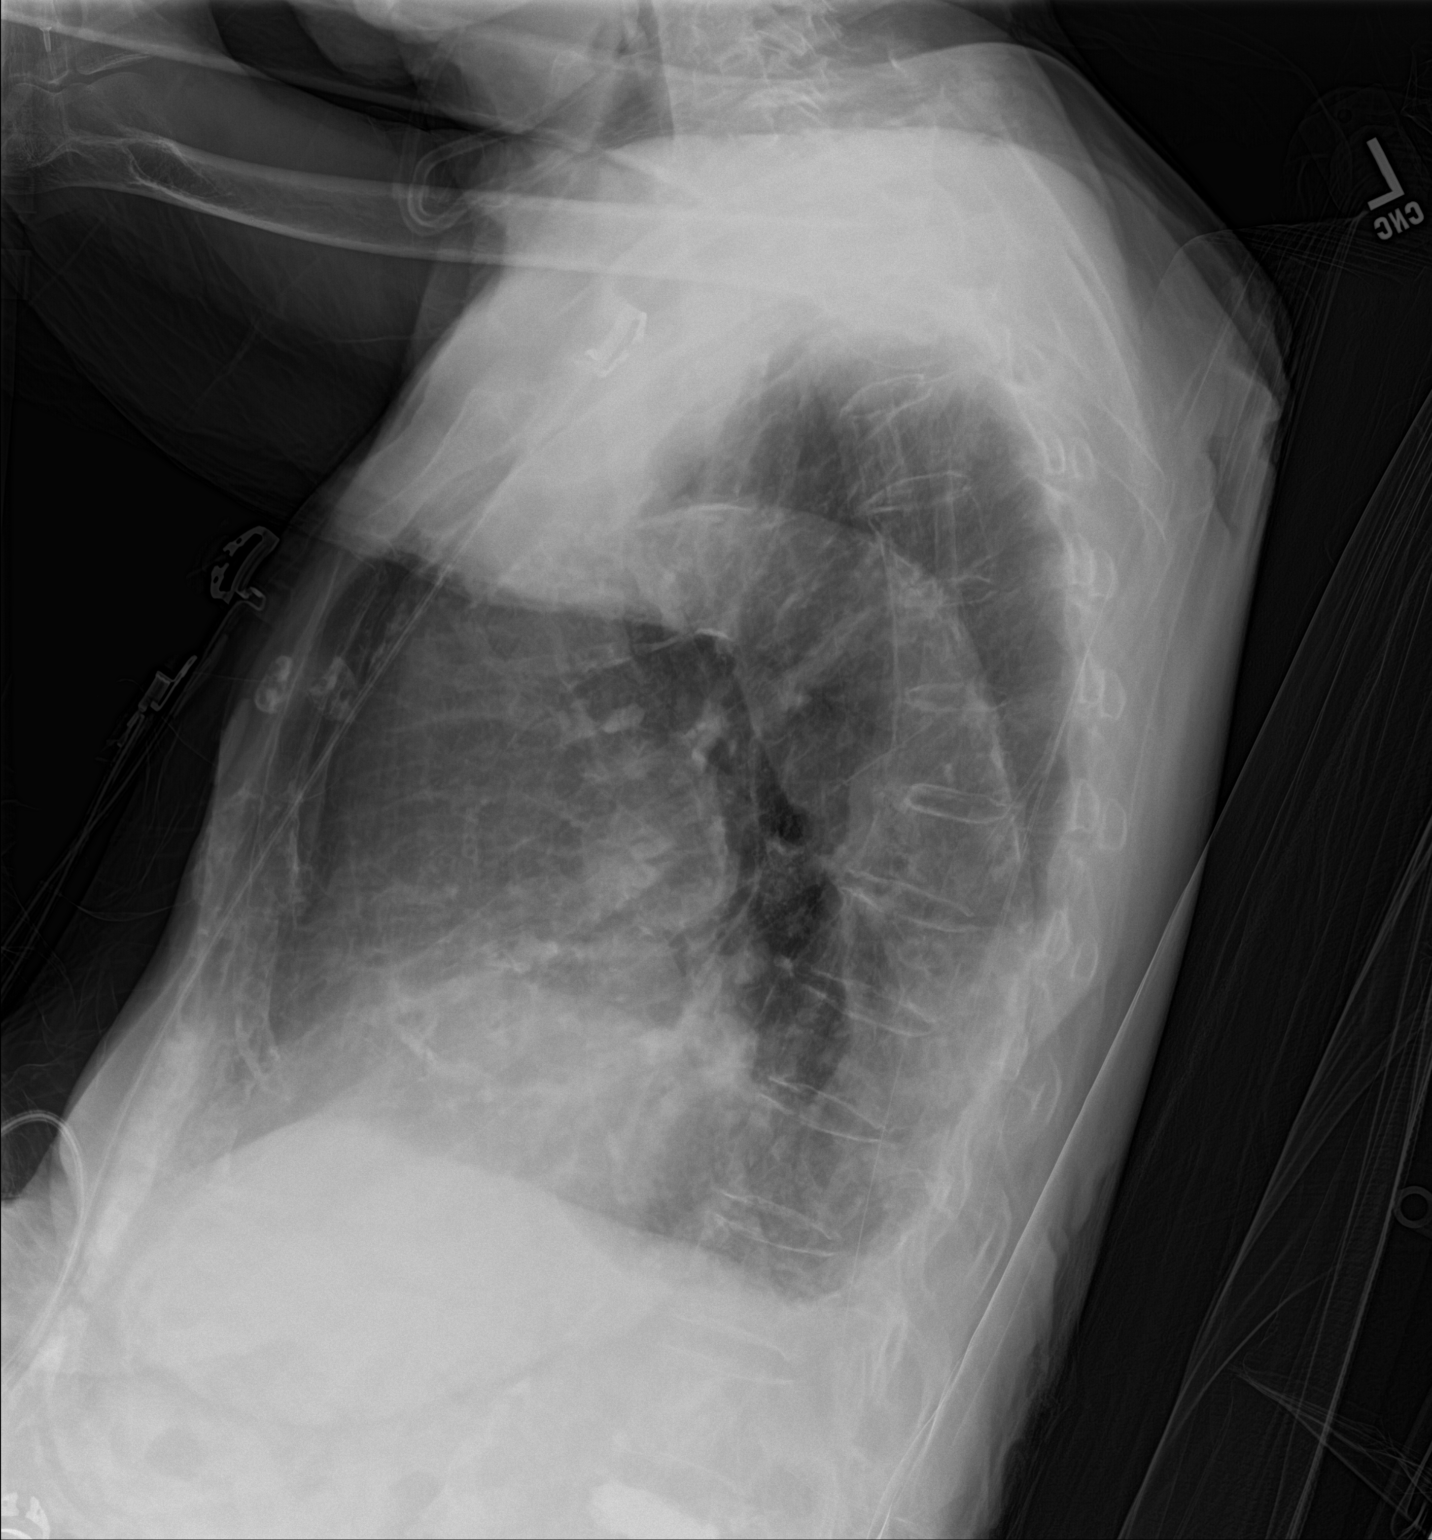
[im 2/2]
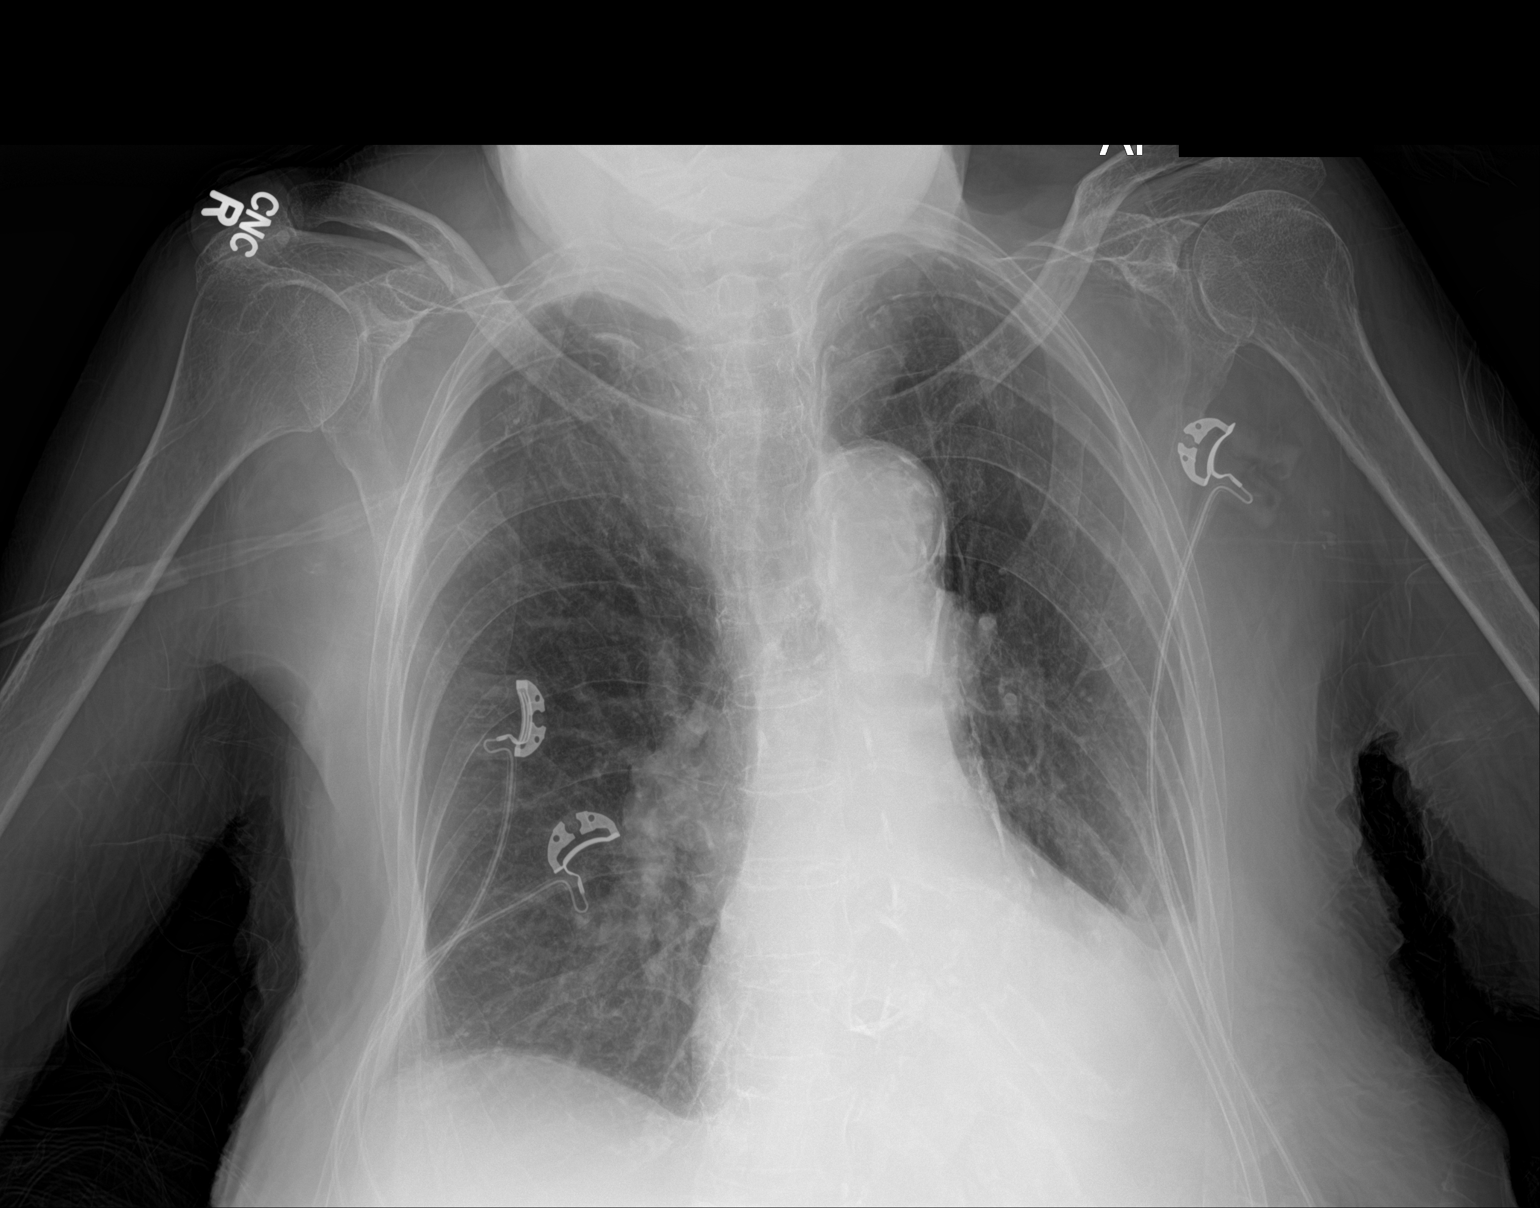

[2 of 2 positions shown; findings below may reference images not displayed]

FINDINGS: The heart is enlarged. A left pleural effusion is present. Left
basilar airspace disease is noted. Aneurysmal dilation of the
descending thoracic aorta is again seen. A small right pleural
effusion and basilar airspace disease is also noted. Emphysema is
evident.
IMPRESSION: 1. Cardiomegaly and mild pulmonary vascular congestion.
2. Moderate left-sided pleural effusion and airspace disease. While
this likely reflects atelectasis, infection is not excluded.
3. Small right pleural effusion and airspace disease, likely
reflecting atelectasis.
4. Stable aneurysm of the descending thoracic aorta.

## 2017-05-02 IMAGING — CR DG SHOULDER 2+V*R*
1 series · 4 of 4 positions shown · non-contrast
Comparison: None.

CLINICAL DATA: Recent fall with right shoulder pain, initial
encounter

EXAM:
RIGHT SHOULDER - 2+ VIEW

[Series 1: x shoulder ap right · 0.14mm/px · 4 of 4 slices shown]
[im 1/4]
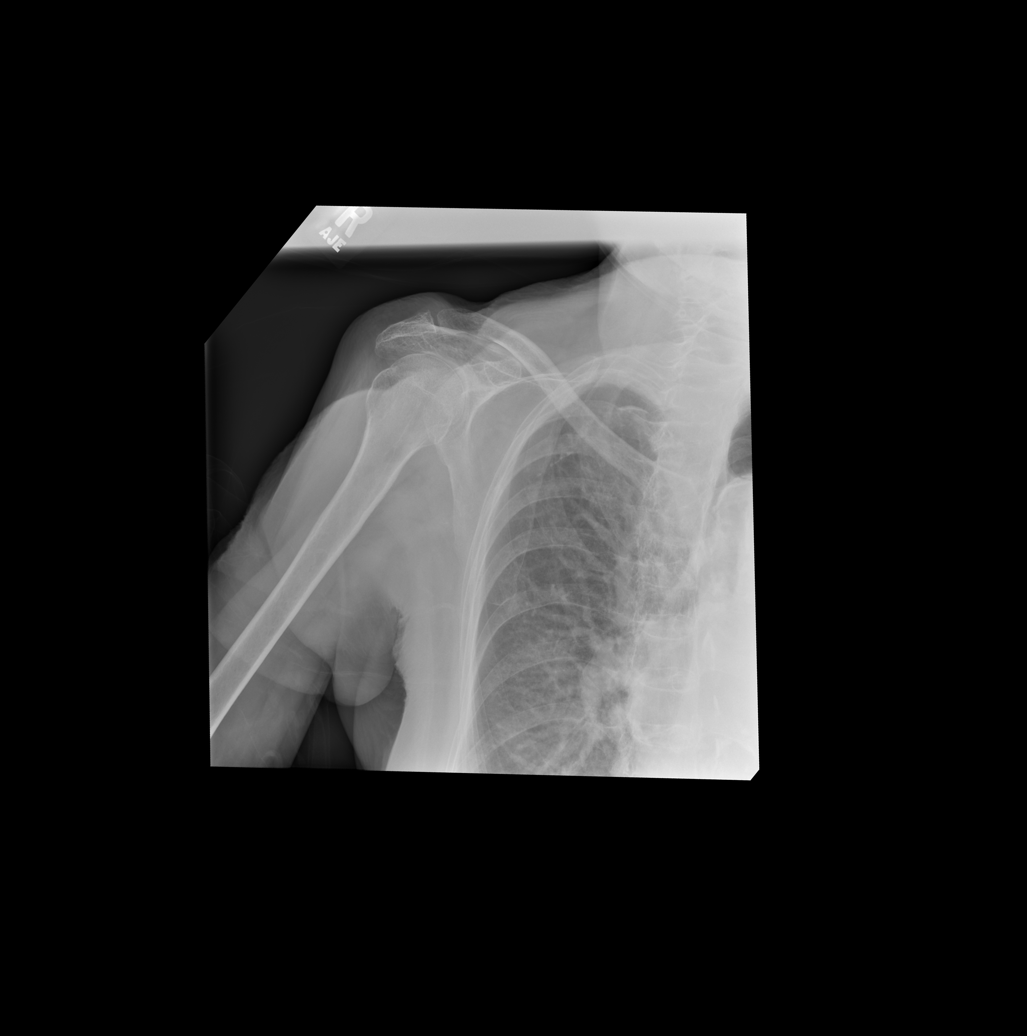
[im 2/4]
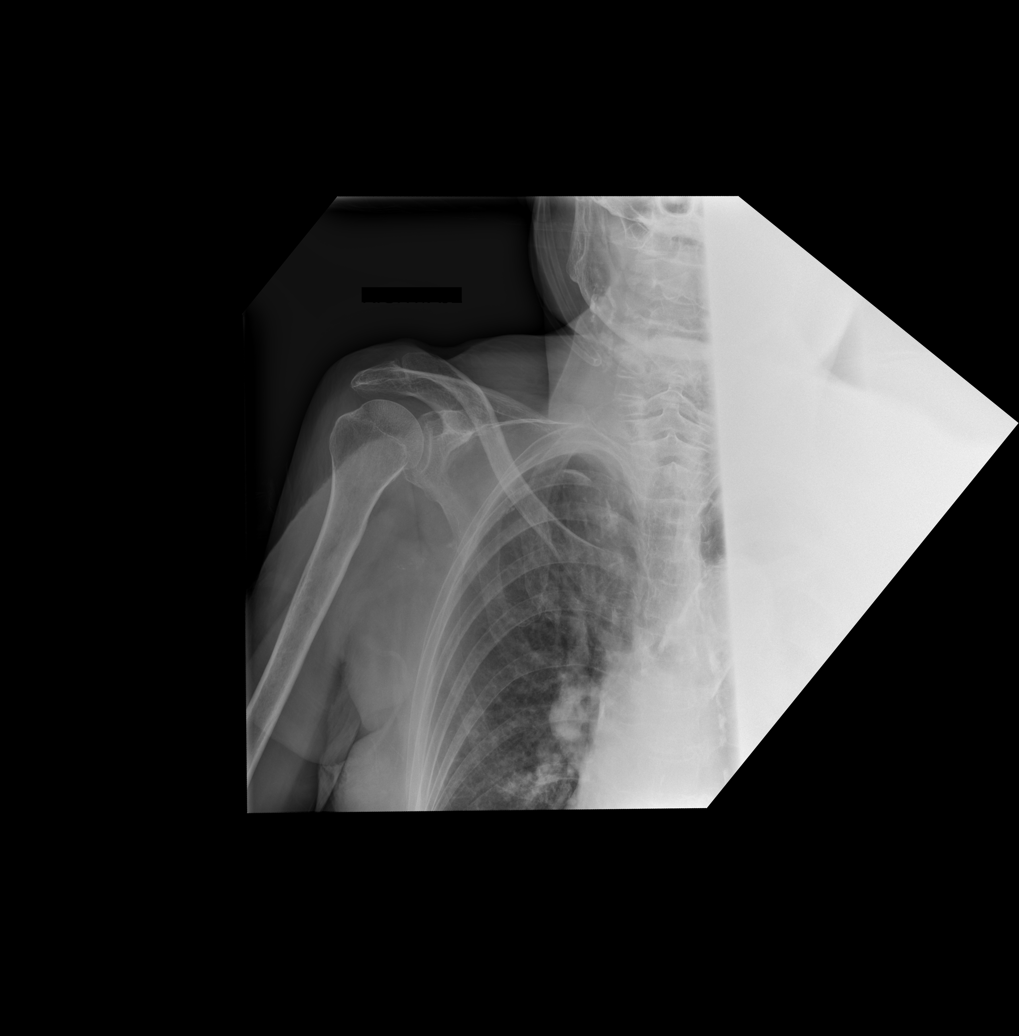
[im 3/4]
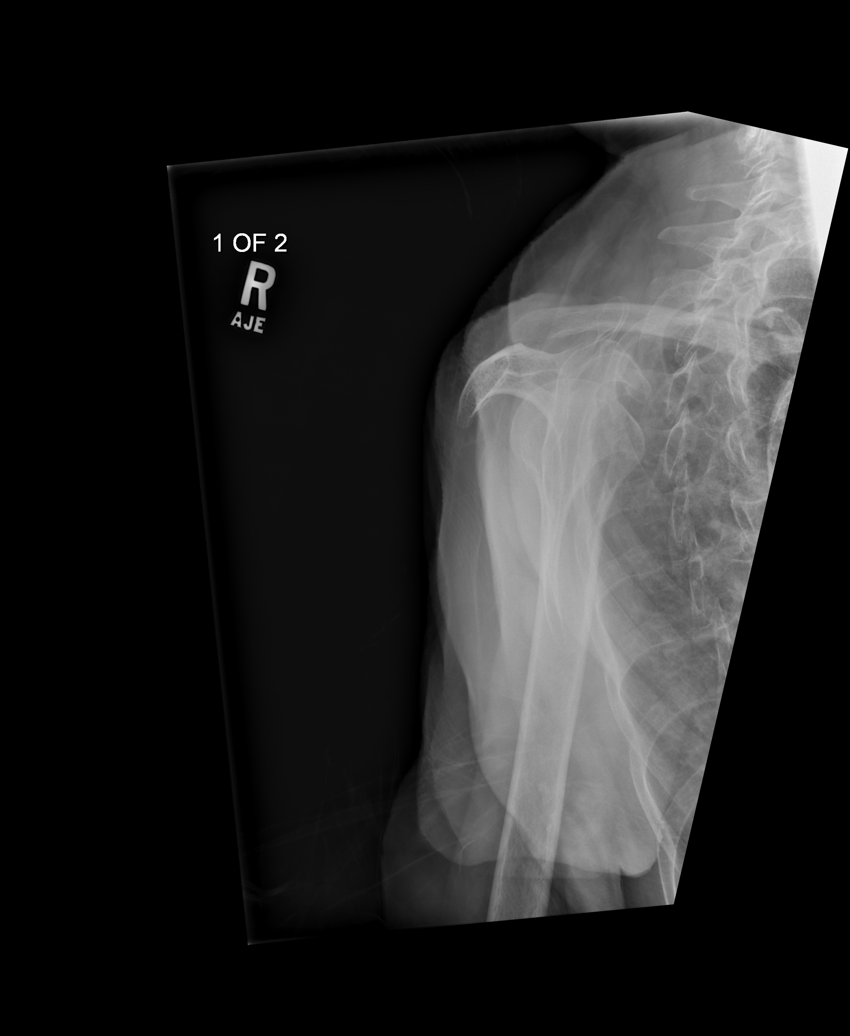
[im 4/4]
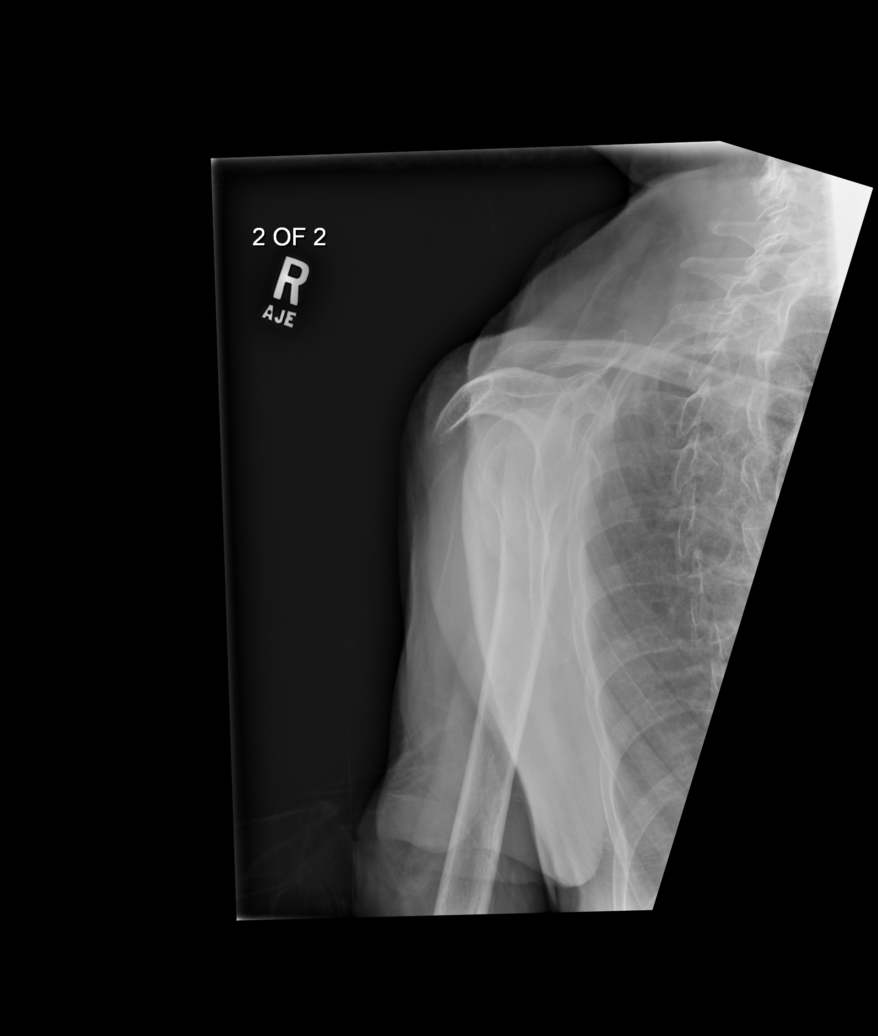

[4 of 4 positions shown; findings below may reference images not displayed]

FINDINGS: Degenerative changes of the acromioclavicular joint are noted. No
acute fracture or dislocation is seen. No gross soft tissue
abnormality is noted.
IMPRESSION: Degenerative change without acute abnormality.
